# Patient Record
Sex: Female | Born: 2000 | Race: Black or African American | Hispanic: No | Marital: Single | Smoking: Never smoker
Health system: Southern US, Community
[De-identification: ages and names within clinical notes are randomized; demographics above are authoritative.]

## PROBLEM LIST (undated history)

## (undated) DIAGNOSIS — S8263XA Displaced fracture of lateral malleolus of unspecified fibula, initial encounter for closed fracture: Secondary | ICD-10-CM

## (undated) DIAGNOSIS — F909 Attention-deficit hyperactivity disorder, unspecified type: Secondary | ICD-10-CM

## (undated) DIAGNOSIS — J45909 Unspecified asthma, uncomplicated: Secondary | ICD-10-CM

## (undated) DIAGNOSIS — F419 Anxiety disorder, unspecified: Secondary | ICD-10-CM

## (undated) DIAGNOSIS — F4310 Post-traumatic stress disorder, unspecified: Secondary | ICD-10-CM

## (undated) DIAGNOSIS — F329 Major depressive disorder, single episode, unspecified: Secondary | ICD-10-CM

## (undated) DIAGNOSIS — F32A Depression, unspecified: Secondary | ICD-10-CM

## (undated) DIAGNOSIS — F840 Autistic disorder: Secondary | ICD-10-CM

## (undated) HISTORY — DX: Anxiety disorder, unspecified: F41.9

## (undated) HISTORY — DX: Post-traumatic stress disorder, unspecified: F43.10

## (undated) HISTORY — DX: Depression, unspecified: F32.A

---
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
## 2018-05-03 DIAGNOSIS — N764 Abscess of vulva: Secondary | ICD-10-CM | POA: Diagnosis not present

## 2018-05-04 DIAGNOSIS — N764 Abscess of vulva: Secondary | ICD-10-CM | POA: Diagnosis not present

## 2018-05-05 DIAGNOSIS — N764 Abscess of vulva: Secondary | ICD-10-CM | POA: Diagnosis not present

## 2018-10-20 DIAGNOSIS — F4310 Post-traumatic stress disorder, unspecified: Secondary | ICD-10-CM | POA: Diagnosis not present

## 2018-10-31 DIAGNOSIS — Z3046 Encounter for surveillance of implantable subdermal contraceptive: Secondary | ICD-10-CM | POA: Diagnosis not present

## 2018-10-31 DIAGNOSIS — Z3009 Encounter for other general counseling and advice on contraception: Secondary | ICD-10-CM | POA: Diagnosis not present

## 2018-11-03 DIAGNOSIS — F4310 Post-traumatic stress disorder, unspecified: Secondary | ICD-10-CM | POA: Diagnosis not present

## 2018-11-04 DIAGNOSIS — H40033 Anatomical narrow angle, bilateral: Secondary | ICD-10-CM | POA: Diagnosis not present

## 2018-11-04 DIAGNOSIS — H1013 Acute atopic conjunctivitis, bilateral: Secondary | ICD-10-CM | POA: Diagnosis not present

## 2018-11-24 DIAGNOSIS — F4310 Post-traumatic stress disorder, unspecified: Secondary | ICD-10-CM | POA: Diagnosis not present

## 2018-12-01 DIAGNOSIS — F4310 Post-traumatic stress disorder, unspecified: Secondary | ICD-10-CM | POA: Diagnosis not present

## 2018-12-08 DIAGNOSIS — F4310 Post-traumatic stress disorder, unspecified: Secondary | ICD-10-CM | POA: Diagnosis not present

## 2018-12-22 DIAGNOSIS — F4310 Post-traumatic stress disorder, unspecified: Secondary | ICD-10-CM | POA: Diagnosis not present

## 2019-01-09 DIAGNOSIS — Z03818 Encounter for observation for suspected exposure to other biological agents ruled out: Secondary | ICD-10-CM | POA: Diagnosis not present

## 2019-01-12 DIAGNOSIS — F4310 Post-traumatic stress disorder, unspecified: Secondary | ICD-10-CM | POA: Diagnosis not present

## 2019-01-24 IMAGING — DX DG TIBIA/FIBULA 2V*R*
4 series · 4 of 4 positions shown · non-contrast
Comparison: 02/17/2014

CLINICAL DATA: Injury

EXAM:
RIGHT TIBIA AND FIBULA - 2 VIEW

[tibia ap (1 of 2)]
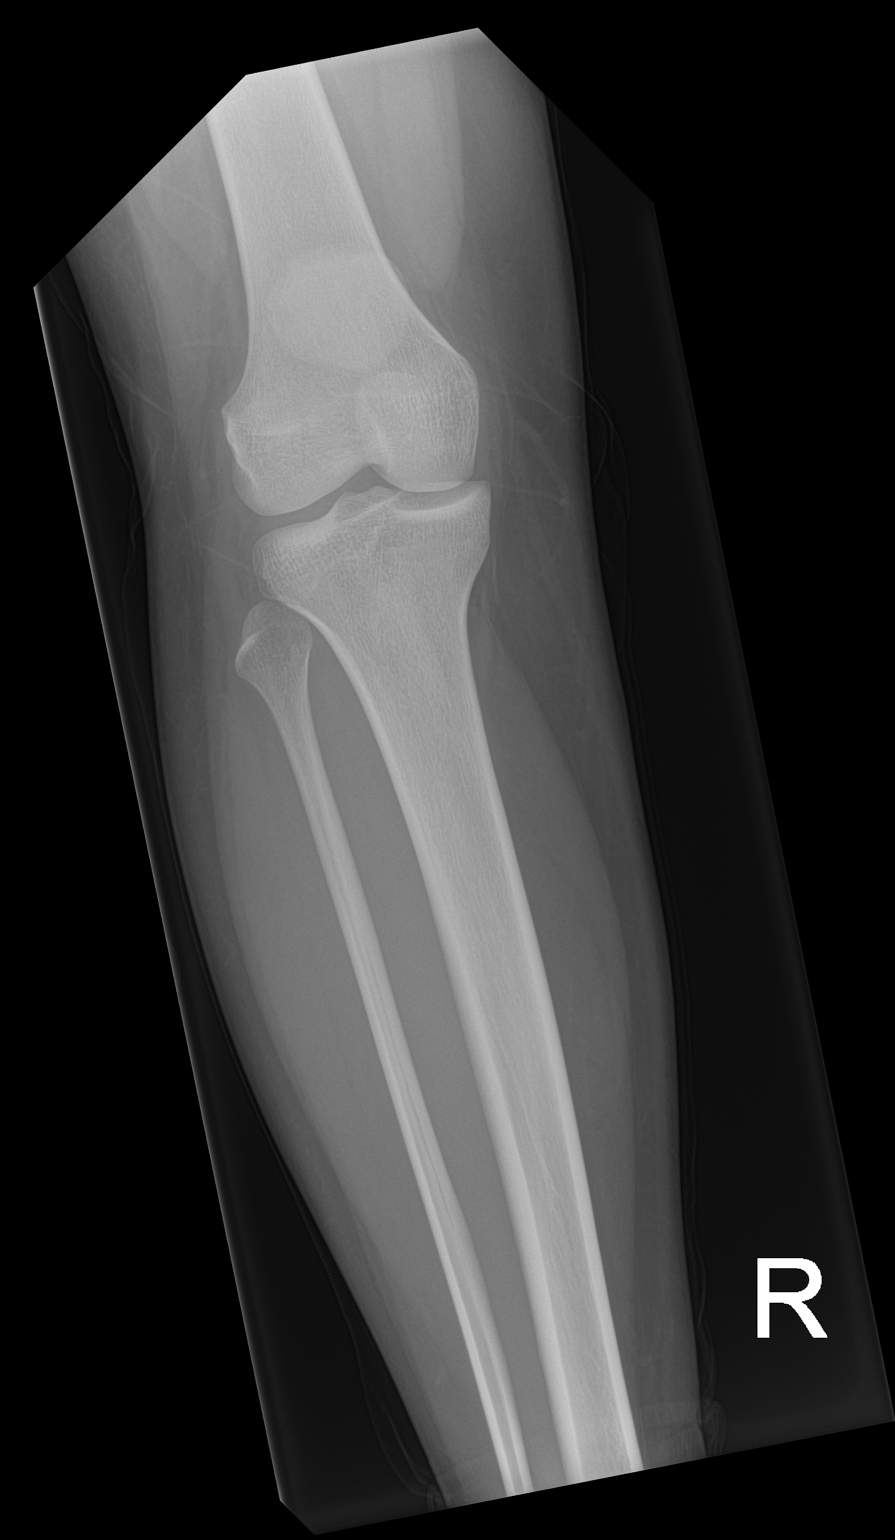

[tibia ap (2 of 2)]
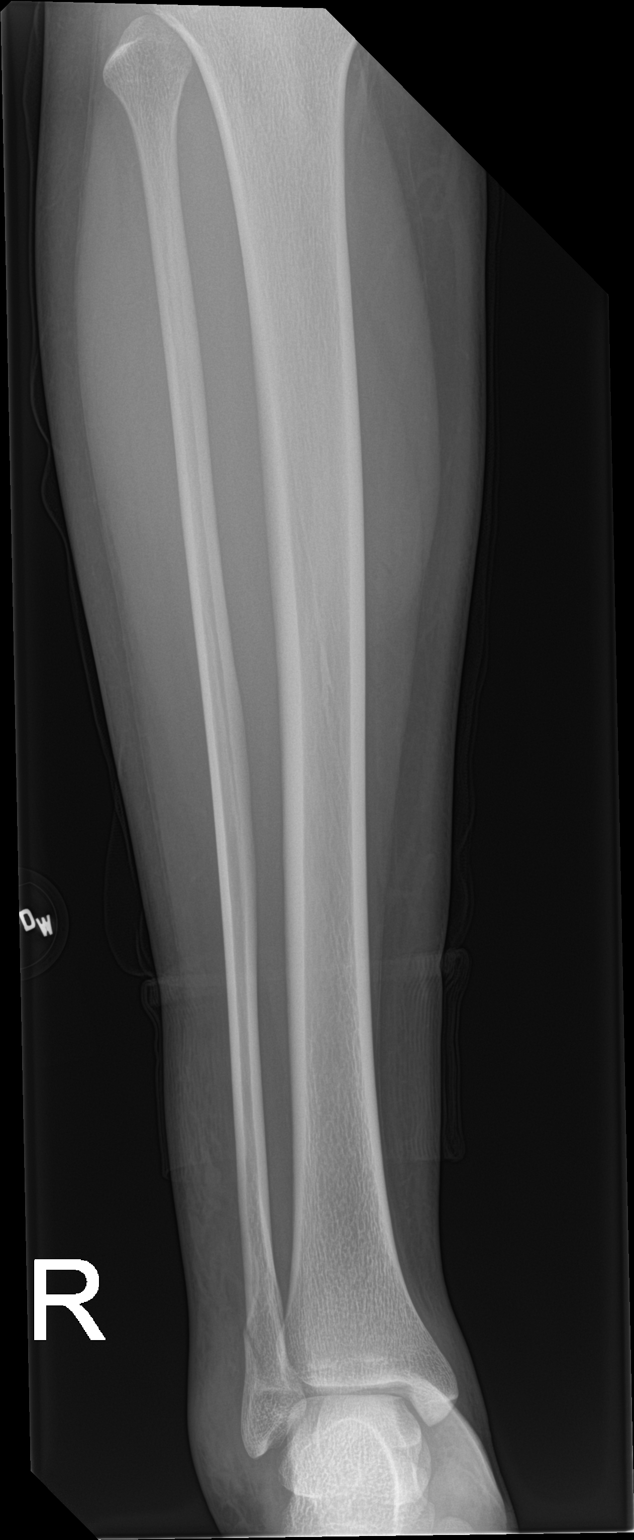

[tibia lat (1 of 2)]
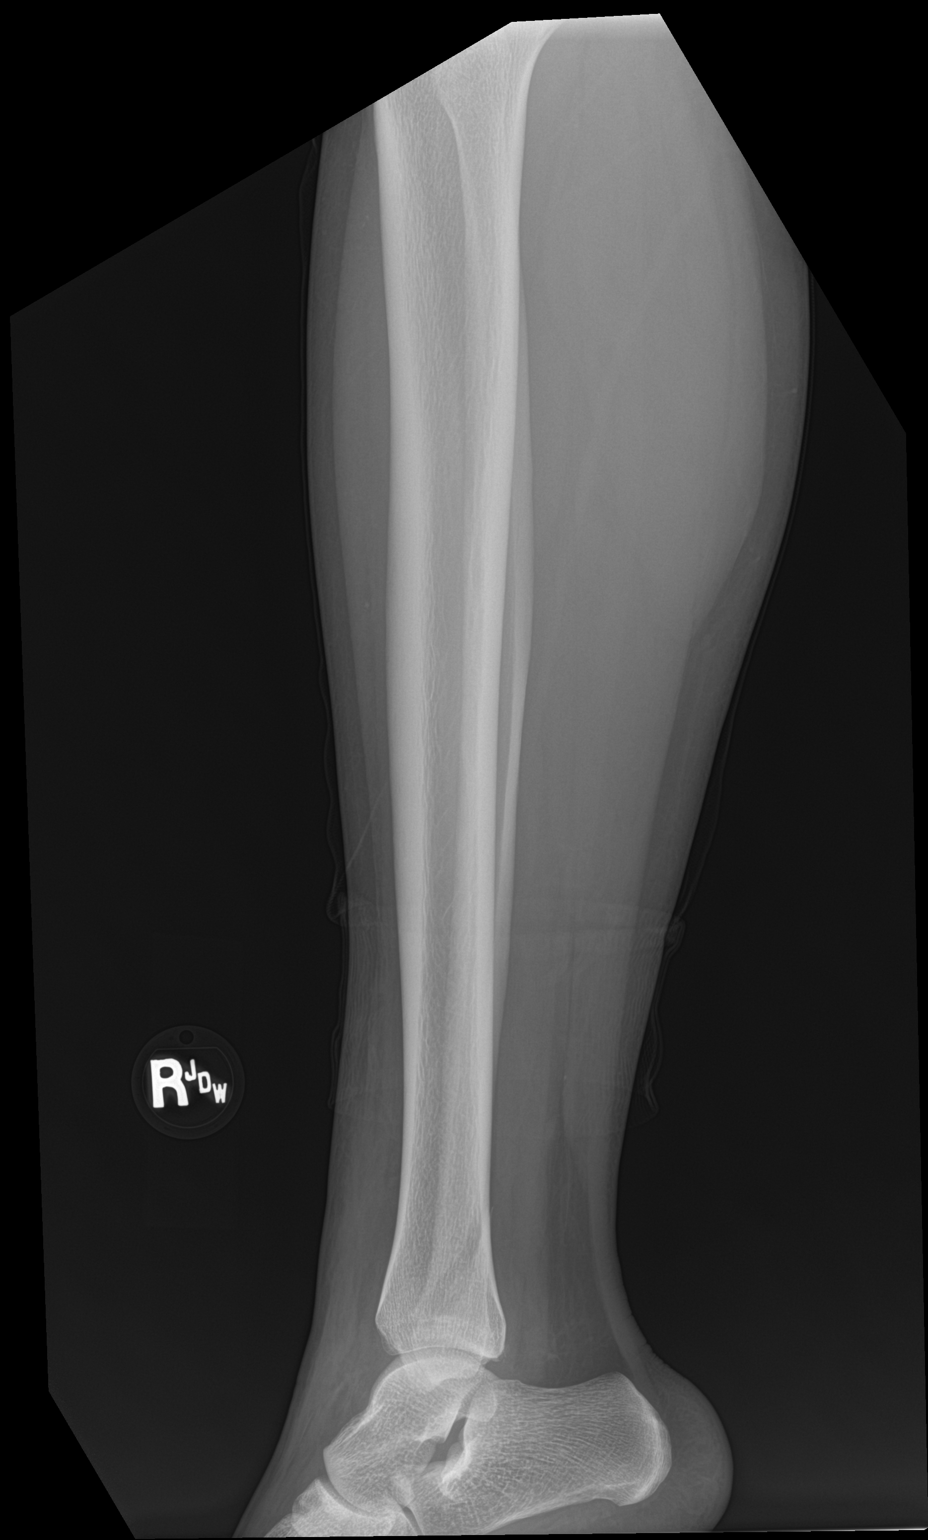

[tibia lat (2 of 2)]
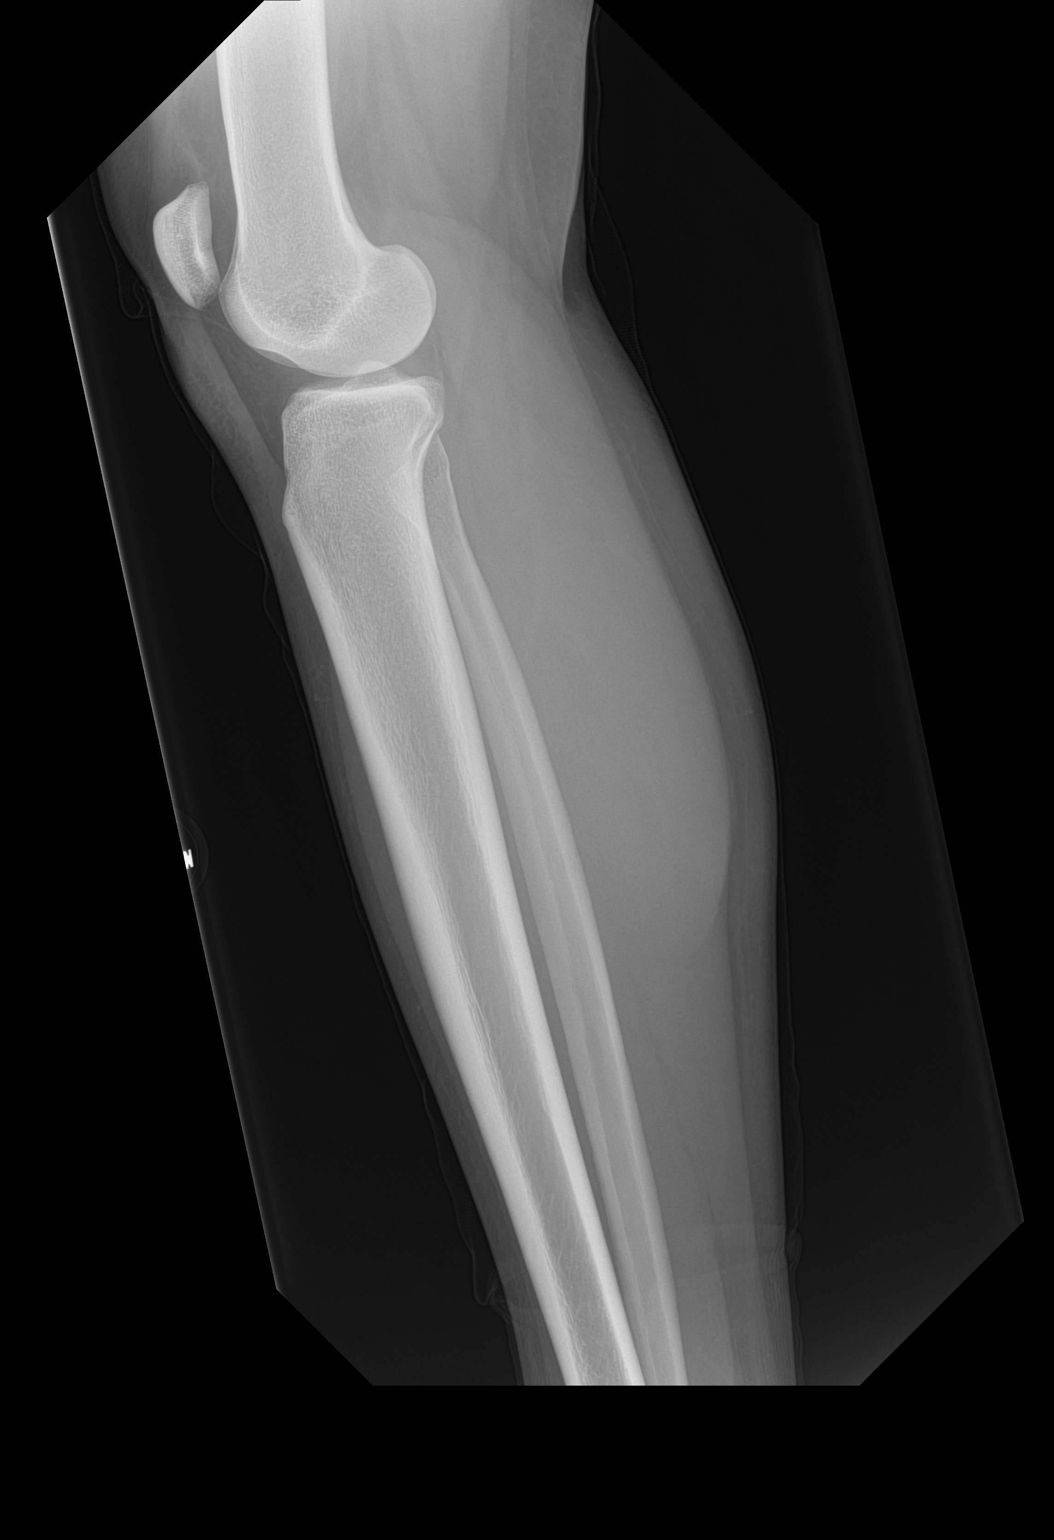

[4 of 4 positions shown; findings below may reference images not displayed]

FINDINGS: The patella is somewhat elevated but this is a stable finding.
Patellar tendon is grossly intact. There is a minimally displaced
fracture involving the distal fibula through the lateral malleolus
which extends into the ankle joint.
IMPRESSION: Minimally displaced acute lateral malleolus fracture. Correlation
with ankle study is recommended.

## 2019-02-17 DIAGNOSIS — F4310 Post-traumatic stress disorder, unspecified: Secondary | ICD-10-CM | POA: Diagnosis not present

## 2019-02-19 DIAGNOSIS — F4310 Post-traumatic stress disorder, unspecified: Secondary | ICD-10-CM | POA: Diagnosis not present

## 2019-03-07 DIAGNOSIS — F4310 Post-traumatic stress disorder, unspecified: Secondary | ICD-10-CM | POA: Diagnosis not present

## 2019-04-13 DIAGNOSIS — F4310 Post-traumatic stress disorder, unspecified: Secondary | ICD-10-CM | POA: Diagnosis not present

## 2019-04-19 DIAGNOSIS — F4310 Post-traumatic stress disorder, unspecified: Secondary | ICD-10-CM | POA: Diagnosis not present

## 2019-05-03 DIAGNOSIS — F4310 Post-traumatic stress disorder, unspecified: Secondary | ICD-10-CM | POA: Diagnosis not present

## 2019-05-07 DIAGNOSIS — J45909 Unspecified asthma, uncomplicated: Secondary | ICD-10-CM | POA: Diagnosis not present

## 2019-05-07 DIAGNOSIS — F4310 Post-traumatic stress disorder, unspecified: Secondary | ICD-10-CM | POA: Diagnosis not present

## 2019-05-07 DIAGNOSIS — F418 Other specified anxiety disorders: Secondary | ICD-10-CM | POA: Diagnosis not present

## 2019-05-17 DIAGNOSIS — F4310 Post-traumatic stress disorder, unspecified: Secondary | ICD-10-CM | POA: Diagnosis not present

## 2019-05-31 DIAGNOSIS — F4310 Post-traumatic stress disorder, unspecified: Secondary | ICD-10-CM | POA: Diagnosis not present

## 2019-06-01 DIAGNOSIS — F4310 Post-traumatic stress disorder, unspecified: Secondary | ICD-10-CM | POA: Diagnosis not present

## 2019-06-28 ENCOUNTER — Other Ambulatory Visit: Payer: Self-pay

## 2019-06-28 ENCOUNTER — Ambulatory Visit: Payer: Federal, State, Local not specified - PPO | Admitting: Neurology

## 2019-06-28 ENCOUNTER — Encounter: Payer: Self-pay | Admitting: Neurology

## 2019-06-28 VITALS — BP 118/90 | HR 86 | Ht 66.0 in | Wt 151.0 lb

## 2019-06-28 DIAGNOSIS — R799 Abnormal finding of blood chemistry, unspecified: Secondary | ICD-10-CM | POA: Diagnosis not present

## 2019-06-28 DIAGNOSIS — F418 Other specified anxiety disorders: Secondary | ICD-10-CM

## 2019-06-28 DIAGNOSIS — R404 Transient alteration of awareness: Secondary | ICD-10-CM | POA: Diagnosis not present

## 2019-06-28 DIAGNOSIS — R7989 Other specified abnormal findings of blood chemistry: Secondary | ICD-10-CM | POA: Diagnosis not present

## 2019-06-28 MED ORDER — LAMOTRIGINE 25 MG PO TABS: ORAL_TABLET | ORAL | 0 refills | Status: DC

## 2019-06-28 MED ORDER — LAMOTRIGINE 100 MG PO TABS: 100.0000 mg | ORAL_TABLET | Freq: Two times a day (BID) | ORAL | 11 refills | Status: DC
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
## 2019-06-29 LAB — COMPREHENSIVE METABOLIC PANEL
ALT: 13 IU/L (ref 0–32)
AST: 15 IU/L (ref 0–40)
Albumin/Globulin Ratio: 1.9 (ref 1.2–2.2)
Albumin: 4.9 g/dL (ref 3.9–5.0)
Alkaline Phosphatase: 83 IU/L (ref 45–106)
BUN/Creatinine Ratio: 11 (ref 9–23)
BUN: 8 mg/dL (ref 6–20)
Bilirubin Total: 0.2 mg/dL (ref 0.0–1.2)
CO2: 22 mmol/L (ref 20–29)
Calcium: 9.9 mg/dL (ref 8.7–10.2)
Chloride: 103 mmol/L (ref 96–106)
Creatinine, Ser: 0.74 mg/dL (ref 0.57–1.00)
GFR calc Af Amer: 135 mL/min/{1.73_m2} (ref >59)
GFR calc non Af Amer: 117 mL/min/{1.73_m2} (ref >59)
Globulin, Total: 2.6 g/dL (ref 1.5–4.5)
Glucose: 83 mg/dL (ref 65–99)
Potassium: 4.2 mmol/L (ref 3.5–5.2)
Sodium: 138 mmol/L (ref 134–144)
Total Protein: 7.5 g/dL (ref 6.0–8.5)

## 2019-06-29 LAB — CBC WITH DIFFERENTIAL
Basophils Absolute: 0.1 10*3/uL (ref 0.0–0.2)
Basos: 1 %
EOS (ABSOLUTE): 0.3 10*3/uL (ref 0.0–0.4)
Eos: 5 %
Hematocrit: 45.4 % (ref 34.0–46.6)
Hemoglobin: 14.1 g/dL (ref 11.1–15.9)
Immature Grans (Abs): 0 10*3/uL (ref 0.0–0.1)
Immature Granulocytes: 0 %
Lymphocytes Absolute: 2.2 10*3/uL (ref 0.7–3.1)
Lymphs: 29 %
MCH: 26.8 pg (ref 26.6–33.0)
MCHC: 31.1 g/dL — ABNORMAL LOW (ref 31.5–35.7)
MCV: 86 fL (ref 79–97)
Monocytes Absolute: 0.6 10*3/uL (ref 0.1–0.9)
Monocytes: 8 %
Neutrophils Absolute: 4.4 10*3/uL (ref 1.4–7.0)
Neutrophils: 57 %
RBC: 5.26 x10E6/uL (ref 3.77–5.28)
RDW: 12.5 % (ref 11.7–15.4)
WBC: 7.6 10*3/uL (ref 3.4–10.8)

## 2019-06-29 LAB — TSH: TSH: 1.57 u[IU]/mL (ref 0.450–4.500)

## 2019-06-29 LAB — HIV ANTIBODY (ROUTINE TESTING W REFLEX): HIV Screen 4th Generation wRfx: NONREACTIVE

## 2019-06-29 LAB — RPR: RPR Ser Ql: NONREACTIVE

## 2019-07-03 ENCOUNTER — Other Ambulatory Visit: Payer: Self-pay

## 2019-07-03 ENCOUNTER — Telehealth: Payer: Self-pay | Admitting: *Deleted

## 2019-07-03 ENCOUNTER — Ambulatory Visit: Payer: Federal, State, Local not specified - PPO | Admitting: Neurology

## 2019-07-03 DIAGNOSIS — R410 Disorientation, unspecified: Secondary | ICD-10-CM | POA: Diagnosis not present

## 2019-07-03 DIAGNOSIS — R404 Transient alteration of awareness: Secondary | ICD-10-CM

## 2019-07-03 DIAGNOSIS — F418 Other specified anxiety disorders: Secondary | ICD-10-CM

## 2019-07-03 NOTE — Telephone Encounter (Signed)
I have spoken to the patient and she has been provided with her lab results.

## 2019-07-03 NOTE — Telephone Encounter (Signed)
-----   Message from Levert Feinstein, MD sent at 07/03/2019  3:11 PM EDT ----- Please call patient for normal laboratory result

## 2019-07-05 ENCOUNTER — Telehealth: Payer: Self-pay | Admitting: Neurology

## 2019-07-05 DIAGNOSIS — R404 Transient alteration of awareness: Secondary | ICD-10-CM

## 2019-07-05 NOTE — Procedures (Signed)
   HISTORY: 19 year old female presented with transient episode of lapse of memory  TECHNIQUE:  This is a routine 16 channel EEG recording with one channel devoted to a limited EKG recording.  It was performed during wakefulness, drowsiness and asleep.  Hyperventilation and photic stimulation were performed as activating procedures.  There are minimum muscle and movement artifact noted.  Upon maximum arousal, posterior dominant waking rhythm consistent of rhythmic alpha range activity, with frequency of 9 hz. Activities are symmetric over the bilateral posterior derivations and attenuated with eye opening.  Hyperventilation produced mild/moderate buildup with higher amplitude and the slower activities noted.  Photic stimulation did not alter the tracing.  During EEG recording, patient developed drowsiness and no deeper stage of sleep was achieved During EEG recording, there was no epileptiform discharge noted.  EKG demonstrate irregular cardiac rhythm 74 bpm  CONCLUSION: This is a  normal awake EEG.  There is no electrodiagnostic evidence of epileptiform discharge.  There was evidence of irregular cardiac rhythm  Levert Feinstein, M.D. Ph.D.  [facility] [address] [place], [place] [address] Phone: [phone] Fax:      [phone]

## 2019-07-05 NOTE — Telephone Encounter (Signed)
Please call patient, EEG was normal, but during EEG recording, there was one channel that was dedicated to EKG, which showed irregular cardiac rhythm, I will refer her to cardiology for further evaluation

## 2019-07-09 NOTE — Telephone Encounter (Signed)
I returned the call to the patient. She verbalized understanding of her results below. She is agreeable to the cardiology referral.

## 2019-07-09 NOTE — Telephone Encounter (Signed)
Patient returned your call.

## 2019-07-09 NOTE — Telephone Encounter (Signed)
I attempted to reach the patient multiple times (both last week and today). There is no answer and her voicemail box has not been set up.

## 2019-07-10 ENCOUNTER — Telehealth: Payer: Self-pay | Admitting: Neurology

## 2019-07-10 NOTE — Telephone Encounter (Signed)
LVM for pt to call back about scheduling mri  [employer] auth: NPR Ref # [id number]

## 2019-07-16 ENCOUNTER — Encounter: Payer: Self-pay | Admitting: Neurology

## 2019-07-17 DIAGNOSIS — Z0000 Encounter for general adult medical examination without abnormal findings: Secondary | ICD-10-CM | POA: Diagnosis not present

## 2019-08-29 ENCOUNTER — Other Ambulatory Visit: Payer: Self-pay

## 2019-08-29 ENCOUNTER — Telehealth: Payer: Self-pay | Admitting: Neurology

## 2019-08-29 ENCOUNTER — Encounter: Payer: Self-pay | Admitting: Neurology

## 2019-08-29 ENCOUNTER — Ambulatory Visit: Payer: Federal, State, Local not specified - PPO | Admitting: Neurology

## 2019-08-29 VITALS — BP 139/98 | HR 94 | Ht 66.0 in | Wt 150.0 lb

## 2019-08-29 DIAGNOSIS — R404 Transient alteration of awareness: Secondary | ICD-10-CM | POA: Diagnosis not present

## 2019-08-29 DIAGNOSIS — F418 Other specified anxiety disorders: Secondary | ICD-10-CM | POA: Diagnosis not present

## 2019-08-29 NOTE — Telephone Encounter (Signed)
Please make sure she is on schedule for MRI brain as previously ordered.
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
## 2019-09-04 NOTE — Telephone Encounter (Signed)
x2 lvm for pt to call back about scheduling mri  

## 2019-09-04 NOTE — Telephone Encounter (Signed)
Patient returned my call she is scheduled at [facility] for 09/12/19. 

## 2019-09-10 ENCOUNTER — Encounter: Payer: Self-pay | Admitting: General Practice

## 2019-09-12 ENCOUNTER — Ambulatory Visit: Payer: Federal, State, Local not specified - PPO

## 2019-09-12 DIAGNOSIS — F418 Other specified anxiety disorders: Secondary | ICD-10-CM

## 2019-09-12 DIAGNOSIS — R404 Transient alteration of awareness: Secondary | ICD-10-CM | POA: Diagnosis not present

## 2019-09-12 MED ORDER — GADOBENATE DIMEGLUMINE 529 MG/ML IV SOLN
15.0000 mL | Freq: Once | INTRAVENOUS | Status: AC | PRN
  Administered 2019-09-12: 15 mL via INTRAVENOUS

## 2019-12-25 ENCOUNTER — Ambulatory Visit
Admission: EM | Admit: 2019-12-25 | Discharge: 2019-12-25 | Disposition: A | Discharge status: Home / Self Care | Payer: Federal, State, Local not specified - PPO | Attending: Emergency Medicine | Admitting: Emergency Medicine

## 2019-12-25 ENCOUNTER — Other Ambulatory Visit: Payer: Self-pay

## 2019-12-25 DIAGNOSIS — K5900 Constipation, unspecified: Secondary | ICD-10-CM | POA: Diagnosis not present

## 2019-12-25 MED ORDER — POLYETHYLENE GLYCOL 3350 17 G PO PACK: 17.0000 g | PACK | Freq: Every day | ORAL | 0 refills | Status: DC

## 2019-12-25 NOTE — ED Provider Notes (Signed)
____________________________________________  Time seen: Approximately 7:51 PM  I have reviewed the triage vital signs and the nursing notes.   HISTORY  Chief Complaint Constipation (x 2 days)   Historian Patient    HPI Chloe Matthews is a 19 y.o. female presents to the urgent care with constipation for the past 2 days.  Patient states that she usually has a bowel movement once every 3 days but she states that stool feels harder to her than usual.  She denies abdominal pain or nausea.  She states that she has taken chocolate laxatives but is attempted no other alleviating measures.  Past Medical History:  Diagnosis Date  . Anxiety and depression   . Asthma    prn inhaler  . Lateral malleolar fracture 05/25/2016   right  . PTSD (post-traumatic stress disorder)      Immunizations up to date:  Yes.     Past Medical History:  Diagnosis Date  . Anxiety and depression   . Asthma    prn inhaler  . Lateral malleolar fracture 05/25/2016   right  . PTSD (post-traumatic stress disorder)     Patient Active Problem List   Diagnosis Date Noted  . Alteration of consciousness 06/28/2019  . Depression with anxiety 06/28/2019  . Chlamydia 03/24/2017  . MDD (major depressive disorder), recurrent episode, severe (HCC) 03/22/2017    Past Surgical History:  Procedure Laterality Date  . ORIF ANKLE FRACTURE Right 06/10/2016   Procedure: OPEN REDUCTION INTERNAL FIXATION (ORIF) RIGHT ANKLE FRACTURE;  Surgeon: Toni Arthurs, MD;  Location: [facility];  Service: Orthopedics;  Laterality: Right;    Prior to Admission medications   Medication Sig Start Date End Date Taking? Authorizing Provider  albuterol (VENTOLIN HFA) 108 (90 Base) MCG/ACT inhaler Inhale into the lungs every 6 (six) hours as needed for wheezing or shortness of breath.    [provider]  loratadine (CLARITIN) 10 MG tablet Take 10 mg by mouth daily.    [provider]   polyethylene glycol (MIRALAX) 17 g packet Take 17 g by mouth daily. 12/25/19   Orvil Feil, PA-C    Allergies Drug class [trazodone and nefazodone] and Pollen extract  Family History  Problem Relation Age of Onset  . Hypertension Mother   . Heart disease Mother        MI  . Stroke Mother   . Hypertension Maternal Grandmother   . Stroke Maternal Grandmother   . Heart disease Maternal Grandfather        MI  . Depression Father     Social History Social History   Tobacco Use  . Smoking status: Passive Smoke Exposure - Never Smoker  . Smokeless tobacco: Never Used  . Tobacco comment: stepfather smoked outside  Vaping Use  . Vaping Use: Never used  Substance Use Topics  . Alcohol use: No  . Drug use: No     Review of Systems  Constitutional: No fever/chills Eyes:  No discharge ENT: No upper respiratory complaints. Respiratory: no cough. No SOB/ use of accessory muscles to breath Gastrointestinal: Patient has constipation.  Musculoskeletal: Negative for musculoskeletal pain. Skin: Negative for rash, abrasions, lacerations, ecchymosis.    ____________________________________________   PHYSICAL EXAM:  VITAL SIGNS: ED Triage Vitals  Enc Vitals Group     BP 12/25/19 1930 136/85     Pulse Rate 12/25/19 1930 86     Resp 12/25/19 1930 17     Temp 12/25/19 1930 98 F (36.7 C)  Temp Source 12/25/19 1930 Oral     SpO2 12/25/19 1930 100 %     Weight --      Height --      Head Circumference --      Peak Flow --      Pain Score 12/25/19 1932 7     Pain Loc --      Pain Edu? --      Excl. in GC? --      Constitutional: Alert and oriented. Well appearing and in no acute distress. Eyes: Conjunctivae are normal. PERRL. EOMI. Head: Atraumatic. Cardiovascular: Normal rate, regular rhythm. Normal S1 and S2.  Good peripheral circulation. Respiratory: Normal respiratory effort without tachypnea or retractions. Lungs CTAB. Good air entry to the bases with no  decreased or absent breath sounds Gastrointestinal: Bowel sounds x 4 quadrants. Soft and nontender to palpation. No guarding or rigidity. No distention. Musculoskeletal: Full range of motion to all extremities. No obvious deformities noted Neurologic:  Normal for age. No gross focal neurologic deficits are appreciated.  Skin:  Skin is warm, dry and intact. No rash noted. Psychiatric: Mood and affect are normal for age. Speech and behavior are normal.   ____________________________________________   LABS (all labs ordered are listed, but only abnormal results are displayed)  Labs Reviewed - No data to display ____________________________________________  EKG   ____________________________________________  RADIOLOGY   No results found.  ____________________________________________    PROCEDURES  Procedure(s) performed:     Procedures     Medications - No data to display   ____________________________________________   INITIAL IMPRESSION / ASSESSMENT AND PLAN / ED COURSE  Pertinent labs & imaging results that were available during my care of the patient were reviewed by me and considered in my medical decision making (see chart for details).      Assessment and plan Constipation 19 year old female presents to the urgent care with constipation for the past 2 days.  Recommended MiraLAX once daily for the next 7 days, increase hydration and increasing intake of soluble fiber.  Return precautions were given to return with new or worsening symptoms.  All patient questions were answered.   ____________________________________________  FINAL CLINICAL IMPRESSION(S) / ED DIAGNOSES  Final diagnoses:  Constipation, unspecified constipation type      NEW MEDICATIONS STARTED DURING THIS VISIT:  ED Discharge Orders         Ordered    polyethylene glycol (MIRALAX) 17 g packet  Daily        12/25/19 1950              This chart was dictated using voice  recognition software/Dragon. Despite best efforts to proofread, errors can occur which can change the meaning. Any change was purely unintentional.     Orvil Feil, PA-C 12/25/19 1953

## 2019-12-25 NOTE — Discharge Instructions (Addendum)
Take Miralax once daily for the next seven days.  

## 2019-12-25 NOTE — ED Triage Notes (Signed)
Patient states she has not had a bowel movement for 2 days and is constipated. Patients also states she only has a bowel movement usually every 3 days. Pt states it won't come out. Pt is aox4 and ambulatory.

## 2020-01-30 DIAGNOSIS — Z7189 Other specified counseling: Secondary | ICD-10-CM | POA: Diagnosis not present

## 2020-01-30 DIAGNOSIS — Z23 Encounter for immunization: Secondary | ICD-10-CM | POA: Diagnosis not present

## 2020-03-04 ENCOUNTER — Encounter: Payer: Self-pay | Admitting: Neurology

## 2020-03-04 ENCOUNTER — Ambulatory Visit: Payer: Federal, State, Local not specified - PPO | Admitting: Neurology
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
## 2020-04-03 ENCOUNTER — Ambulatory Visit (HOSPITAL_COMMUNITY)
Admission: EM | Admit: 2020-04-03 | Discharge: 2020-04-04 | Disposition: A | Discharge status: Home / Self Care | Payer: Federal, State, Local not specified - PPO | Attending: Nurse Practitioner | Admitting: Nurse Practitioner

## 2020-04-03 ENCOUNTER — Other Ambulatory Visit: Payer: Self-pay

## 2020-04-03 DIAGNOSIS — F332 Major depressive disorder, recurrent severe without psychotic features: Secondary | ICD-10-CM | POA: Insufficient documentation

## 2020-04-03 DIAGNOSIS — F411 Generalized anxiety disorder: Secondary | ICD-10-CM

## 2020-04-03 DIAGNOSIS — R45851 Suicidal ideations: Secondary | ICD-10-CM | POA: Diagnosis not present

## 2020-04-03 DIAGNOSIS — Z7722 Contact with and (suspected) exposure to environmental tobacco smoke (acute) (chronic): Secondary | ICD-10-CM | POA: Diagnosis not present

## 2020-04-03 DIAGNOSIS — Z20822 Contact with and (suspected) exposure to covid-19: Secondary | ICD-10-CM | POA: Diagnosis not present

## 2020-04-03 DIAGNOSIS — F4310 Post-traumatic stress disorder, unspecified: Secondary | ICD-10-CM | POA: Diagnosis not present

## 2020-04-03 DIAGNOSIS — Z818 Family history of other mental and behavioral disorders: Secondary | ICD-10-CM | POA: Insufficient documentation

## 2020-04-03 LAB — CBC WITH DIFFERENTIAL/PLATELET
Abs Immature Granulocytes: 0.01 10*3/uL (ref 0.00–0.07)
Basophils Absolute: 0.1 10*3/uL (ref 0.0–0.1)
Basophils Relative: 1 %
Eosinophils Absolute: 0.2 10*3/uL (ref 0.0–0.5)
Eosinophils Relative: 3 %
HCT: 42.9 % (ref 36.0–46.0)
Hemoglobin: 13.7 g/dL (ref 12.0–15.0)
Immature Granulocytes: 0 %
Lymphocytes Relative: 31 %
Lymphs Abs: 2.5 10*3/uL (ref 0.7–4.0)
MCH: 27.3 pg (ref 26.0–34.0)
MCHC: 31.9 g/dL (ref 30.0–36.0)
MCV: 85.6 fL (ref 80.0–100.0)
Monocytes Absolute: 0.7 10*3/uL (ref 0.1–1.0)
Monocytes Relative: 9 %
Neutro Abs: 4.6 10*3/uL (ref 1.7–7.7)
Neutrophils Relative %: 56 %
Platelets: 302 10*3/uL (ref 150–400)
RBC: 5.01 MIL/uL (ref 3.87–5.11)
RDW: 12.1 % (ref 11.5–15.5)
WBC: 8 10*3/uL (ref 4.0–10.5)
nRBC: 0 % (ref 0.0–0.2)

## 2020-04-03 LAB — POCT URINE DRUG SCREEN - MANUAL ENTRY (I-SCREEN)
POC Amphetamine UR: NOT DETECTED
POC Buprenorphine (BUP): NOT DETECTED
POC Cocaine UR: NOT DETECTED
POC Marijuana UR: NOT DETECTED
POC Methadone UR: NOT DETECTED
POC Methamphetamine UR: NOT DETECTED
POC Morphine: NOT DETECTED
POC Oxazepam (BZO): NOT DETECTED
POC Oxycodone UR: NOT DETECTED
POC Secobarbital (BAR): NOT DETECTED

## 2020-04-03 LAB — RESP PANEL BY RT-PCR (FLU A&B, COVID) ARPGX2
Influenza A by PCR: NEGATIVE
Influenza B by PCR: NEGATIVE
SARS Coronavirus 2 by RT PCR: NEGATIVE

## 2020-04-03 LAB — POCT PREGNANCY, URINE: Preg Test, Ur: NEGATIVE

## 2020-04-03 LAB — POC SARS CORONAVIRUS 2 AG -  ED: SARS Coronavirus 2 Ag: NEGATIVE

## 2020-04-03 MED ORDER — MAGNESIUM HYDROXIDE 400 MG/5ML PO SUSP: 30.0000 mL | Freq: Every day | ORAL | Status: DC | PRN

## 2020-04-03 MED ORDER — ESCITALOPRAM OXALATE 10 MG PO TABS: 10.0000 mg | ORAL_TABLET | Freq: Every day | ORAL | Status: DC

## 2020-04-03 MED ORDER — ESCITALOPRAM OXALATE 5 MG PO TABS
5.0000 mg | ORAL_TABLET | Freq: Once | ORAL | Status: AC
  Administered 2020-04-03: 5 mg via ORAL
  Filled 2020-04-03: qty 1

## 2020-04-03 MED ORDER — ACETAMINOPHEN 325 MG PO TABS: 650.0000 mg | ORAL_TABLET | Freq: Four times a day (QID) | ORAL | Status: DC | PRN

## 2020-04-03 MED ORDER — HYDROXYZINE HCL 10 MG PO TABS
10.0000 mg | ORAL_TABLET | Freq: Three times a day (TID) | ORAL | Status: DC | PRN
  Administered 2020-04-03: 10 mg via ORAL
  Filled 2020-04-03: qty 1

## 2020-04-03 MED ORDER — ALUM & MAG HYDROXIDE-SIMETH 200-200-20 MG/5ML PO SUSP: 30.0000 mL | ORAL | Status: DC | PRN

## 2020-04-03 NOTE — BH Assessment (Addendum)
Triage: Chloe Matthews is a 20 year old female who presents to [facility] after speaking with her therapist today, who recommended she present to the [facility] for inpatient admission for suicidal ideations and possibly started on an anti-depressant. Pt reports having suicidal thoughts for the "past week". She further explains having thoughts to jump from the parking garage on campus [facility]). She reports having past suicide attempts "Once or twice a year since I was 20yo". Pt denied HI/AVH.  Determination of Need: Urgent
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
## 2020-04-03 NOTE — ED Notes (Signed)
Pt A&O x 4, presents with suicidal ideations, plan to jump off bridge or cypher gas.  Feeling worthless.  Skin search completed, no distress noted.  Denies HI or AVH.  Pt calm & cooperative.  Monitoring for safety.

## 2020-04-04 ENCOUNTER — Telehealth (HOSPITAL_COMMUNITY): Payer: Self-pay | Admitting: Psychiatry

## 2020-04-04 DIAGNOSIS — F332 Major depressive disorder, recurrent severe without psychotic features: Secondary | ICD-10-CM | POA: Diagnosis not present

## 2020-04-04 DIAGNOSIS — F4310 Post-traumatic stress disorder, unspecified: Secondary | ICD-10-CM

## 2020-04-04 DIAGNOSIS — F411 Generalized anxiety disorder: Secondary | ICD-10-CM | POA: Diagnosis not present

## 2020-04-04 LAB — TSH: TSH: 2.297 u[IU]/mL (ref 0.350–4.500)

## 2020-04-04 LAB — COMPREHENSIVE METABOLIC PANEL
ALT: 17 U/L (ref 0–44)
AST: 23 U/L (ref 15–41)
Albumin: 4.6 g/dL (ref 3.5–5.0)
Alkaline Phosphatase: 74 U/L (ref 38–126)
Anion gap: 10 (ref 5–15)
BUN: 9 mg/dL (ref 6–20)
CO2: 22 mmol/L (ref 22–32)
Calcium: 9.8 mg/dL (ref 8.9–10.3)
Chloride: 108 mmol/L (ref 98–111)
Creatinine, Ser: 0.7 mg/dL (ref 0.44–1.00)
GFR, Estimated: >60 mL/min (ref >60)
Glucose, Bld: 66 mg/dL — ABNORMAL LOW (ref 70–99)
Potassium: 3.3 mmol/L — ABNORMAL LOW (ref 3.5–5.1)
Sodium: 140 mmol/L (ref 135–145)
Total Bilirubin: 0.7 mg/dL (ref 0.3–1.2)
Total Protein: 8.3 g/dL — ABNORMAL HIGH (ref 6.5–8.1)

## 2020-04-04 LAB — LIPID PANEL
Cholesterol: 187 mg/dL (ref 0–200)
HDL: 80 mg/dL (ref >40)
LDL Cholesterol: 93 mg/dL (ref 0–99)
Total CHOL/HDL Ratio: 2.3 RATIO
Triglycerides: 68 mg/dL (ref <150)
VLDL: 14 mg/dL (ref 0–40)

## 2020-04-04 LAB — HEMOGLOBIN A1C
Hgb A1c MFr Bld: 5.4 % (ref 4.8–5.6)
Mean Plasma Glucose: 108.28 mg/dL

## 2020-04-04 MED ORDER — HYDROXYZINE HCL 10 MG PO TABS: 10.0000 mg | ORAL_TABLET | Freq: Three times a day (TID) | ORAL | 1 refills | Status: DC | PRN

## 2020-04-04 MED ORDER — POTASSIUM CHLORIDE CRYS ER 20 MEQ PO TBCR
20.0000 meq | EXTENDED_RELEASE_TABLET | Freq: Once | ORAL | Status: AC
  Administered 2020-04-04: 20 meq via ORAL
  Filled 2020-04-04: qty 1

## 2020-04-04 MED ORDER — ESCITALOPRAM OXALATE 10 MG PO TABS: 10.0000 mg | ORAL_TABLET | Freq: Every day | ORAL | 1 refills | Status: DC

## 2020-04-04 NOTE — ED Notes (Signed)
Pt offered breakfast; declined; given orange juice.

## 2020-04-04 NOTE — Discharge Instructions (Addendum)
Prescriptions sent to pharmacy at discharge. Patient agreeable to plan.  Given opportunity to ask questions.  Appears to feel comfortable with discharge denies any current suicidal or homicidal thought. Patient is also instructed prior to discharge to: Take all medications as prescribed by his/her mental healthcare provider. Report any adverse effects and or reactions from the medicines to her outpatient provider promptly. Patient has been instructed & cautioned: To not engage in alcohol and or illegal drug use while on prescription medicines. In the event of worsening symptoms, patient is instructed to call the crisis hotline, 911 and or go to the nearest ED for appropriate evaluation and treatment of symptoms. To follow-up with her primary care provider for your other medical issues, concerns and or health care needs.

## 2020-04-04 NOTE — ED Notes (Signed)
Patient A&O x 4, ambulatory. Patient discharged in no acute distress. Patient denied SI/HI, A/VH upon discharge. Patient verbalized understanding of all discharge instructions explained by staff, to include follow up appointments, RX's and safety plan. Patient escorted to lobby via staff for transport to home via her boyfriend. Safety maintained.
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
## 2020-04-04 NOTE — Telephone Encounter (Signed)
D:  Dr. Leone Haven referred pt to PHP/MH-IOP.  A:  Placed call to pt, but there was no answer and her vm is full.  Will try again on Mon. 04-07-20.  Inform Dr. Leone Haven.

## 2020-04-04 NOTE — ED Notes (Signed)
Pt sleeping in no acute distress. RR even and unlabored. Safety maintained. 

## 2020-04-04 NOTE — ED Notes (Signed)
Pt sleeping@this time. Breathing even and unlabored. Will continue to monitor for safety 

## 2020-04-04 NOTE — ED Notes (Signed)
Pt sitting up in chair. Denies SI/HI/AVH. Calm, cooperative with staff. Writer asked pt what was different about her feelings today vs yesterday, she replied, "I want to give the Lexapro and outpatient therapy a chance to work. I want to live. I just want the feelings of wanting to hurt myself to stop. I'm in college for business so I do have something going for myself". Praise and encouragement given. Informed pt to notify staff with any urges/impulses to harm self. Will monitor for safety.

## 2020-04-07 ENCOUNTER — Telehealth (HOSPITAL_COMMUNITY): Payer: Self-pay | Admitting: Psychiatry

## 2020-04-07 ENCOUNTER — Telehealth (HOSPITAL_COMMUNITY): Payer: Self-pay | Admitting: Family Medicine

## 2020-04-07 NOTE — Telephone Encounter (Signed)
Care Management - Follow Up [facility] Discharges   Writer attempted to make contact with patient today and was unsuccessful.  Writer was able to leave a HIPPA compliant voice message and will await callback.   

## 2020-04-29 DIAGNOSIS — F4310 Post-traumatic stress disorder, unspecified: Secondary | ICD-10-CM | POA: Diagnosis not present

## 2020-05-06 DIAGNOSIS — F418 Other specified anxiety disorders: Secondary | ICD-10-CM | POA: Diagnosis not present

## 2020-05-06 DIAGNOSIS — F4310 Post-traumatic stress disorder, unspecified: Secondary | ICD-10-CM | POA: Diagnosis not present

## 2020-05-10 DIAGNOSIS — Z708 Other sex counseling: Secondary | ICD-10-CM | POA: Diagnosis not present

## 2020-07-03 DIAGNOSIS — R109 Unspecified abdominal pain: Secondary | ICD-10-CM | POA: Diagnosis not present

## 2020-07-03 DIAGNOSIS — K5900 Constipation, unspecified: Secondary | ICD-10-CM | POA: Diagnosis not present

## 2020-07-03 DIAGNOSIS — F419 Anxiety disorder, unspecified: Secondary | ICD-10-CM | POA: Diagnosis not present

## 2020-07-11 DIAGNOSIS — G4700 Insomnia, unspecified: Secondary | ICD-10-CM | POA: Diagnosis not present

## 2020-07-11 DIAGNOSIS — F411 Generalized anxiety disorder: Secondary | ICD-10-CM | POA: Diagnosis not present

## 2020-07-11 DIAGNOSIS — F331 Major depressive disorder, recurrent, moderate: Secondary | ICD-10-CM | POA: Diagnosis not present

## 2020-07-11 DIAGNOSIS — F4310 Post-traumatic stress disorder, unspecified: Secondary | ICD-10-CM | POA: Diagnosis not present

## 2020-08-18 DIAGNOSIS — F4310 Post-traumatic stress disorder, unspecified: Secondary | ICD-10-CM | POA: Diagnosis not present

## 2020-09-04 DIAGNOSIS — F4310 Post-traumatic stress disorder, unspecified: Secondary | ICD-10-CM | POA: Diagnosis not present

## 2020-09-04 DIAGNOSIS — L03114 Cellulitis of left upper limb: Secondary | ICD-10-CM | POA: Diagnosis not present

## 2020-10-02 DIAGNOSIS — Z113 Encounter for screening for infections with a predominantly sexual mode of transmission: Secondary | ICD-10-CM | POA: Diagnosis not present

## 2020-10-02 DIAGNOSIS — Z20822 Contact with and (suspected) exposure to covid-19: Secondary | ICD-10-CM | POA: Diagnosis not present

## 2020-10-02 DIAGNOSIS — Z03818 Encounter for observation for suspected exposure to other biological agents ruled out: Secondary | ICD-10-CM | POA: Diagnosis not present

## 2020-10-02 DIAGNOSIS — Z202 Contact with and (suspected) exposure to infections with a predominantly sexual mode of transmission: Secondary | ICD-10-CM | POA: Diagnosis not present

## 2020-10-13 DIAGNOSIS — F4310 Post-traumatic stress disorder, unspecified: Secondary | ICD-10-CM | POA: Diagnosis not present

## 2020-10-23 DIAGNOSIS — Z03818 Encounter for observation for suspected exposure to other biological agents ruled out: Secondary | ICD-10-CM | POA: Diagnosis not present

## 2020-10-23 DIAGNOSIS — Z20822 Contact with and (suspected) exposure to covid-19: Secondary | ICD-10-CM | POA: Diagnosis not present

## 2020-11-11 DIAGNOSIS — Z03818 Encounter for observation for suspected exposure to other biological agents ruled out: Secondary | ICD-10-CM | POA: Diagnosis not present

## 2020-11-11 DIAGNOSIS — Z20822 Contact with and (suspected) exposure to covid-19: Secondary | ICD-10-CM | POA: Diagnosis not present

## 2021-04-02 DIAGNOSIS — F840 Autistic disorder: Secondary | ICD-10-CM | POA: Diagnosis not present

## 2021-05-19 DIAGNOSIS — F840 Autistic disorder: Secondary | ICD-10-CM | POA: Diagnosis not present

## 2021-07-09 DIAGNOSIS — F4310 Post-traumatic stress disorder, unspecified: Secondary | ICD-10-CM | POA: Diagnosis not present

## 2021-07-22 DIAGNOSIS — F4310 Post-traumatic stress disorder, unspecified: Secondary | ICD-10-CM | POA: Diagnosis not present

## 2021-08-12 DIAGNOSIS — F4310 Post-traumatic stress disorder, unspecified: Secondary | ICD-10-CM | POA: Diagnosis not present

## 2021-08-29 DIAGNOSIS — F4310 Post-traumatic stress disorder, unspecified: Secondary | ICD-10-CM | POA: Diagnosis not present

## 2022-04-26 ENCOUNTER — Ambulatory Visit
Admission: EM | Admit: 2022-04-26 | Discharge: 2022-04-26 | Disposition: A | Discharge status: Home / Self Care | Payer: Federal, State, Local not specified - PPO | Attending: Urgent Care | Admitting: Urgent Care

## 2022-04-26 DIAGNOSIS — F840 Autistic disorder: Secondary | ICD-10-CM

## 2022-04-26 DIAGNOSIS — F909 Attention-deficit hyperactivity disorder, unspecified type: Secondary | ICD-10-CM

## 2022-04-26 DIAGNOSIS — F419 Anxiety disorder, unspecified: Secondary | ICD-10-CM

## 2022-04-26 HISTORY — DX: Autistic disorder: F84.0

## 2022-04-26 HISTORY — DX: Attention-deficit hyperactivity disorder, unspecified type: F90.9
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
## 2022-04-26 NOTE — ED Triage Notes (Signed)
Patient presents to UC for anxiety related to work stress. States she has being bullied at work causing her to feel anxious. Currently trying to transfer departments. These events have caused her to miss work. Hx of anxiety, does not take any medications.

## 2022-05-26 ENCOUNTER — Encounter (HOSPITAL_COMMUNITY): Payer: Self-pay | Admitting: Student in an Organized Health Care Education/Training Program

## 2022-05-26 ENCOUNTER — Ambulatory Visit (HOSPITAL_BASED_OUTPATIENT_CLINIC_OR_DEPARTMENT_OTHER)
Payer: Federal, State, Local not specified - PPO | Admitting: Student in an Organized Health Care Education/Training Program

## 2022-05-26 VITALS — BP 131/86 | HR 90 | Resp 12 | Wt 169.0 lb

## 2022-05-26 DIAGNOSIS — F902 Attention-deficit hyperactivity disorder, combined type: Secondary | ICD-10-CM | POA: Diagnosis not present

## 2022-05-26 MED ORDER — METHYLPHENIDATE HCL ER (LA) 20 MG PO CP24: 20.0000 mg | ORAL_CAPSULE | Freq: Every day | ORAL | 0 refills | Status: DC

## 2022-05-26 NOTE — Patient Instructions (Signed)
Please contact one of the following facilities to start medication management and therapy services:   [facility]  [address] [place], [place] [address] [phone]  [facility]  [address], [place], [place] [address] [phone]  [facility]  [address]  [place], [place] [address] [phone]  [facility]  [address] [place], [place] [address] [phone]  [facility]  [address],  [place], [place] [address] [phone]  
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
## 2022-06-01 ENCOUNTER — Telehealth (HOSPITAL_COMMUNITY): Payer: Self-pay

## 2022-06-01 ENCOUNTER — Telehealth (HOSPITAL_COMMUNITY): Payer: Self-pay | Admitting: *Deleted

## 2022-06-01 NOTE — Telephone Encounter (Signed)
Medication management - Prior authorization completed online with CoverMyMeds for her prescribed Methylphenidate HCL ER (LA) 20 mg capsules and approved by [employer]. Called pt's [facility] to inform as pharmacist reported not going through yet but would keep trying since just approved and would call us back if any issues.

## 2022-06-01 NOTE — Telephone Encounter (Signed)
Fax received for approval of Methylphnidate ER 20mg  from 05/02/22-06/01/2023.

## 2022-07-07 ENCOUNTER — Ambulatory Visit (HOSPITAL_COMMUNITY): Payer: Medicaid Other | Admitting: Student in an Organized Health Care Education/Training Program

## 2022-07-07 NOTE — Progress Notes (Deleted)
BH MD/PA/NP OP Progress Note  07/07/2022 1:00 PM Chloe Matthews  MRN:  [id number]  Chief Complaint: No chief complaint on file.  HPI: Chloe Matthews is a 22 yo patient with PPH of ASD,ADHD combined type ,PTSD, and MDD.    Visit Diagnosis: No diagnosis found.  Past Psychiatric History:  NPT: 03/2017- Dx MDD,  OPT: none SA: per EMR attempted via hanging in high school, patient reports that she has tried other times, but doesn't remember what the attempts were Hx of self harm via headbanging and cutting OPT: Therapist: Romeo Apple, [facility] in the past  Has had ASD testing 05/2021- at [facility], "Based on the findings of the current evaluation, Chloe Matthews meets the DSM-5 diagnostic criteria for Autism Spectrum Disorder (ASD)....Chloe Matthews's overall intellectual ability is within the Average range. However, she was noted to have weaknesses in working memory and other executive function skills such as cognitive flexibility, emotion regulation, inhibition, initiation, and planning and organization. These executive function deficits are consistent with diagnoses of ASD and ADHD.She will likely need assistance in meeting the naturally occurring demands of the everyday environment. The behavioral symptoms of depression were reported to be present at a clinically significant level, which is consistent with her previous diagnosis of major depressive disorder.  " Meds in past: Lexapro, Hydrozyxine 25mg , Celexa, Trazodone (oversedated), reports that when she ran out and could not find a provider she had really bad anxiety  05/2022- Started patient on Ritalin LA 20mg  for ADHD    Past Medical History:  Past Medical History:  Diagnosis Date   ADHD (attention deficit hyperactivity disorder)    Anxiety and depression    Asthma    prn inhaler   Autism spectrum disorder    Lateral malleolar fracture 05/25/2016   right   PTSD (post-traumatic stress disorder)     Past Surgical History:  Procedure  Laterality Date   ORIF ANKLE FRACTURE Right 06/10/2016   Procedure: OPEN REDUCTION INTERNAL FIXATION (ORIF) RIGHT ANKLE FRACTURE;  Surgeon: Toni Arthurs, MD;  Location: [facility];  Service: Orthopedics;  Laterality: Right;    Family Psychiatric History:   Sister: Bipolar (lithium) P GMA: Schizophrenia P Aunts: Bipolar Mom: ADHD and Autism  Family History:  Family History  Problem Relation Age of Onset   Hypertension Mother    Heart disease Mother        MI   Stroke Mother    Hypertension Maternal Grandmother    Stroke Maternal Grandmother    Heart disease Maternal Grandfather        MI   Depression Father     Social History:  Social History   Socioeconomic History   Marital status: Single    Spouse name: Not on file   Number of children: 0   Years of education: college student   Highest education level: Not on file  Occupational History   Occupation: Consulting civil engineer at [employer] in [place]    Comment: major Health and safety inspector  Tobacco Use   Smoking status: Never    Passive exposure: Yes   Smokeless tobacco: Never   Tobacco comments:    stepfather smoked outside  [employer] Use   Vaping Use: Never used  Substance and Sexual Activity   Alcohol use: No   Drug use: No   Sexual activity: Yes    Birth control/protection: Implant  Other Topics Concern   Not on file  Social History Narrative   Lives with her maternal grandmother.   Right-handed.   No  daily caffeine use.   Social Determinants of Health   Financial Resource Strain: Not on file  Food Insecurity: Not on file  Transportation Needs: Not on file  Physical Activity: Not on file  Stress: Not on file  Social Connections: Not on file    Allergies:  Allergies  Allergen Reactions   Bee Pollen    Drug Class [Trazodone And Nefazodone]     Pt said she had a hard time being waking up   Pollen Extract     Metabolic Disorder Labs: Lab Results  Component Value Date   HGBA1C 5.4 04/03/2020    MPG 108.28 04/03/2020   No results found for: "PROLACTIN" Lab Results  Component Value Date   CHOL 187 04/03/2020   TRIG 68 04/03/2020   HDL 80 04/03/2020   CHOLHDL 2.3 04/03/2020   VLDL 14 04/03/2020   LDLCALC 93 04/03/2020   Lab Results  Component Value Date   TSH 2.297 04/03/2020   TSH 1.570 06/28/2019    Therapeutic Level Labs: No results found for: "LITHIUM" No results found for: "VALPROATE" No results found for: "CBMZ"  Current Medications: Current Outpatient Medications  Medication Sig Dispense Refill   albuterol (VENTOLIN HFA) 108 (90 Base) MCG/ACT inhaler Inhale into the lungs every 6 (six) hours as needed for wheezing or shortness of breath.     loratadine (CLARITIN) 10 MG tablet Take 10 mg by mouth daily.     methylphenidate (RITALIN LA) 20 MG 24 hr capsule Take 1 capsule (20 mg total) by mouth daily. 30 capsule 0   No current facility-administered medications for this visit.     Musculoskeletal: Strength & Muscle Tone: {desc; muscle tone:32375} Gait & Station: {PE GAIT ED ZOXW:96045} Patient leans: {Patient Leans:21022755}  Psychiatric Specialty Exam: Review of Systems  There were no vitals taken for this visit.There is no height or weight on file to calculate BMI.  General Appearance: {Appearance:22683}  Eye Contact:  {BHH EYE CONTACT:22684}  Speech:  {Speech:22685}  Volume:  {Volume (PAA):22686}  Mood:  {BHH MOOD:22306}  Affect:  {Affect (PAA):22687}  Thought Process:  {Thought Process (PAA):22688}  Orientation:  {BHH ORIENTATION (PAA):22689}  Thought Content: {Thought Content:22690}   Suicidal Thoughts:  {ST/HT (PAA):22692}  Homicidal Thoughts:  {ST/HT (PAA):22692}  Memory:  {BHH MEMORY:22881}  Judgement:  {Judgement (PAA):22694}  Insight:  {Insight (PAA):22695}  Psychomotor Activity:  {Psychomotor (PAA):22696}  Concentration:  {Concentration:21399}  Recall:  {BHH GOOD/FAIR/POOR:22877}  Fund of Knowledge: {BHH GOOD/FAIR/POOR:22877}   Language: {BHH GOOD/FAIR/POOR:22877}  Akathisia:  {BHH YES OR NO:22294}  Handed:  {Handed:22697}  AIMS (if indicated): {Desc; done/not:10129}  Assets:  {Assets (PAA):22698}  ADL's:  {BHH WUJ'W:11914}  Cognition: {chl bhh cognition:304700322}  Sleep:  {BHH GOOD/FAIR/POOR:22877}   Screenings: AIMS    Flowsheet Row [facility]  AIMS Total Score 0      Flowsheet Row ED from 04/26/2022 in [facility]) ED from 04/03/2020 in [facility]  C-SSRS RISK CATEGORY No Risk High Risk        Assessment and Plan: ***  Collaboration of Care: Collaboration of Care: [facility] OP Collaboration of NWGN:[id number]}  Patient/Guardian was advised Release of Information must be obtained prior to any record release in order to collaborate their care with an outside provider. Patient/Guardian was advised if they have not already done so to contact the registration department to sign all necessary forms in order for [place] to release information  regarding their care.   Consent: Patient/Guardian gives verbal consent for treatment and assignment of benefits for services provided during this visit. Patient/Guardian expressed understanding and agreed to proceed.    Bobbye Morton, MD 07/07/2022, 1:00 PM

## 2022-12-09 DIAGNOSIS — O26891 Other specified pregnancy related conditions, first trimester: Secondary | ICD-10-CM | POA: Diagnosis not present

## 2022-12-09 DIAGNOSIS — Z3A01 Less than 8 weeks gestation of pregnancy: Secondary | ICD-10-CM | POA: Diagnosis not present

## 2022-12-09 DIAGNOSIS — R109 Unspecified abdominal pain: Secondary | ICD-10-CM | POA: Diagnosis not present

## 2022-12-09 DIAGNOSIS — Z6741 Type O blood, Rh negative: Secondary | ICD-10-CM | POA: Diagnosis not present

## 2022-12-09 DIAGNOSIS — Z3A10 10 weeks gestation of pregnancy: Secondary | ICD-10-CM | POA: Diagnosis not present

## 2022-12-09 DIAGNOSIS — O209 Hemorrhage in early pregnancy, unspecified: Secondary | ICD-10-CM | POA: Diagnosis not present

## 2022-12-09 DIAGNOSIS — N939 Abnormal uterine and vaginal bleeding, unspecified: Secondary | ICD-10-CM | POA: Diagnosis not present

## 2023-02-02 NOTE — L&D Delivery Note (Signed)
 Delivery Note Delivered precipitously At 7:41 AM a viable and healthy female was delivered via Vaginal, Spontaneous (Presentation: Right Occiput Anterior).  APGAR: , ; weight  .   Placenta status: Spontaneous, Intact.  Cord: 3 vessels with the following complications:  .  Anesthesia: Epidural Episiotomy: None Lacerations: None Suture Repair: none Est. Blood Loss (mL):  125  Mom to postpartum.  Baby to Couplet care / Skin to Skin.  Holmes Lusher 06/26/2023, 7:55 AM

## 2023-05-17 ENCOUNTER — Inpatient Hospital Stay (HOSPITAL_COMMUNITY)

## 2023-05-17 ENCOUNTER — Encounter: Payer: Self-pay | Admitting: Family Medicine

## 2023-05-17 ENCOUNTER — Inpatient Hospital Stay (HOSPITAL_COMMUNITY)
Admission: AD | Admit: 2023-05-17 | Discharge: 2023-05-17 | Disposition: A | Discharge status: Home / Self Care | Attending: Obstetrics & Gynecology | Admitting: Obstetrics & Gynecology

## 2023-05-17 ENCOUNTER — Telehealth: Payer: Self-pay | Admitting: Family Medicine

## 2023-05-17 ENCOUNTER — Encounter (HOSPITAL_COMMUNITY): Payer: Self-pay | Admitting: Obstetrics & Gynecology

## 2023-05-17 ENCOUNTER — Other Ambulatory Visit: Payer: Self-pay

## 2023-05-17 DIAGNOSIS — Z3A33 33 weeks gestation of pregnancy: Secondary | ICD-10-CM

## 2023-05-17 DIAGNOSIS — E669 Obesity, unspecified: Secondary | ICD-10-CM

## 2023-05-17 DIAGNOSIS — K5901 Slow transit constipation: Secondary | ICD-10-CM

## 2023-05-17 DIAGNOSIS — O26893 Other specified pregnancy related conditions, third trimester: Secondary | ICD-10-CM | POA: Diagnosis not present

## 2023-05-17 DIAGNOSIS — Z369 Encounter for antenatal screening, unspecified: Secondary | ICD-10-CM | POA: Insufficient documentation

## 2023-05-17 DIAGNOSIS — O99213 Obesity complicating pregnancy, third trimester: Secondary | ICD-10-CM

## 2023-05-17 DIAGNOSIS — O0933 Supervision of pregnancy with insufficient antenatal care, third trimester: Secondary | ICD-10-CM | POA: Insufficient documentation

## 2023-05-17 DIAGNOSIS — Z3A32 32 weeks gestation of pregnancy: Secondary | ICD-10-CM | POA: Insufficient documentation

## 2023-05-17 DIAGNOSIS — Z3689 Encounter for other specified antenatal screening: Secondary | ICD-10-CM

## 2023-05-17 LAB — RAPID HIV SCREEN (HIV 1/2 AB+AG)
HIV 1/2 Antibodies: NONREACTIVE
HIV-1 P24 Antigen - HIV24: NONREACTIVE

## 2023-05-17 LAB — CBC
HCT: 35.2 % — ABNORMAL LOW (ref 36.0–46.0)
Hemoglobin: 11.4 g/dL — ABNORMAL LOW (ref 12.0–15.0)
MCH: 27.3 pg (ref 26.0–34.0)
MCHC: 32.4 g/dL (ref 30.0–36.0)
MCV: 84.4 fL (ref 80.0–100.0)
Platelets: 300 10*3/uL (ref 150–400)
RBC: 4.17 MIL/uL (ref 3.87–5.11)
RDW: 12.4 % (ref 11.5–15.5)
WBC: 10.9 10*3/uL — ABNORMAL HIGH (ref 4.0–10.5)
nRBC: 0 % (ref 0.0–0.2)

## 2023-05-17 LAB — DIFFERENTIAL
Abs Immature Granulocytes: 0.06 10*3/uL (ref 0.00–0.07)
Basophils Absolute: 0.1 10*3/uL (ref 0.0–0.1)
Basophils Relative: 1 %
Eosinophils Absolute: 0.1 10*3/uL (ref 0.0–0.5)
Eosinophils Relative: 1 %
Immature Granulocytes: 1 %
Lymphocytes Relative: 18 %
Lymphs Abs: 1.9 10*3/uL (ref 0.7–4.0)
Monocytes Absolute: 0.9 10*3/uL (ref 0.1–1.0)
Monocytes Relative: 8 %
Neutro Abs: 7.8 10*3/uL — ABNORMAL HIGH (ref 1.7–7.7)
Neutrophils Relative %: 71 %

## 2023-05-17 LAB — URINALYSIS, ROUTINE W REFLEX MICROSCOPIC
Bilirubin Urine: NEGATIVE
Glucose, UA: NEGATIVE mg/dL
Hgb urine dipstick: NEGATIVE
Ketones, ur: NEGATIVE mg/dL
Leukocytes,Ua: NEGATIVE
Nitrite: NEGATIVE
Protein, ur: NEGATIVE mg/dL
Specific Gravity, Urine: 1.01 (ref 1.005–1.030)
pH: 7 (ref 5.0–8.0)

## 2023-05-17 LAB — RPR: RPR Ser Ql: NONREACTIVE

## 2023-05-17 LAB — TYPE AND SCREEN
ABO/RH(D): O NEG
Antibody Screen: NEGATIVE

## 2023-05-17 LAB — HEPATITIS B SURFACE ANTIGEN: Hepatitis B Surface Ag: NONREACTIVE

## 2023-05-17 LAB — WET PREP, GENITAL
Clue Cells Wet Prep HPF POC: NONE SEEN
Sperm: NONE SEEN
Trich, Wet Prep: NONE SEEN
WBC, Wet Prep HPF POC: >=10 — AB (ref <10)
Yeast Wet Prep HPF POC: NONE SEEN

## 2023-05-17 LAB — HIV ANTIBODY (ROUTINE TESTING W REFLEX): HIV Screen 4th Generation wRfx: NONREACTIVE

## 2023-05-17 MED ORDER — PREPLUS 27-1 MG PO TABS: 1.0000 | ORAL_TABLET | Freq: Every day | ORAL | 13 refills | Status: AC

## 2023-05-17 NOTE — MAU Note (Signed)
 Chloe Matthews is a 23 y.o. at Unknown here in MAU reporting: she having difficulty passing stool, states it hurts to pass, last BM yesterday.  Reports had difficulty passing stool secondary the size.  Reports she has purchased stool softeners, suppositories, and a laxative but hasn't tried the laxative.    Reports no PNC with the pregnancy.  States had two ultrasounds and they were both normal, decided no to get [facility] secondary normal ultrasounds.  LMP: 08/31/2022 Onset of complaint: March 2025 Pain score: 0 Vitals:   05/17/23 0935  BP: 127/88  Pulse: 94  Resp: 18  Temp: (!) 97.3 F (36.3 C)  SpO2: 100%     FHT: 140 bpm  Lab orders placed from triage: UA

## 2023-05-17 NOTE — Telephone Encounter (Signed)
 Called patient to setup [facility] from inbasket messages. There was no answer and VM was full to leave a message.

## 2023-05-17 NOTE — MAU Provider Note (Signed)
 History     CSN: [id number]  Arrival date and time: 05/17/23 0907   Event Date/Time   First Provider Initiated Contact with Patient 05/17/23 0957      Chief Complaint  Patient presents with   Constipation   Patient is 23 y.o. G1P0 [redacted]w[redacted]d here with complaints of constipation. She reports her last BM was yesterday. She reports she will go long stretches without BM. She noted some blood in the toilet after a BM. She reports BMs have been small and hard. She is having to push hard to have them. She has had a history of constipation.   +FM, denies LOF, VB, contractions, vaginal discharge.   Constipation Pertinent negatives include no abdominal pain, back pain, diarrhea, fever, nausea or vomiting.    OB History     Gravida  1   Para      Term      Preterm      AB      Living         SAB      IAB      Ectopic      Multiple      Live Births              Past Medical History:  Diagnosis Date   ADHD (attention deficit hyperactivity disorder)    Anxiety and depression    Asthma    prn inhaler, exercise induced   Autism spectrum disorder    Lateral malleolar fracture 05/25/2016   right   PTSD (post-traumatic stress disorder)     Past Surgical History:  Procedure Laterality Date   ORIF ANKLE FRACTURE Right 06/10/2016   Procedure: OPEN REDUCTION INTERNAL FIXATION (ORIF) RIGHT ANKLE FRACTURE;  Surgeon: Amada Backer, MD;  Location: [facility];  Service: Orthopedics;  Laterality: Right;    Family History  Problem Relation Age of Onset   Hypertension Mother    Heart disease Mother        MI   Stroke Mother    Hypertension Maternal Grandmother    Stroke Maternal Grandmother    Heart disease Maternal Grandfather        MI   Depression Father     Social History   Tobacco Use   Smoking status: Never    Passive exposure: Yes   Smokeless tobacco: Never   Tobacco comments:    stepfather smoked outside  Vaping Use   Vaping status: Some  Days  Substance Use Topics   Alcohol use: No   Drug use: No    Allergies:  Allergies  Allergen Reactions   Bee Pollen    Drug Class [Trazodone  And Nefazodone]     Pt said she had a hard time being waking up   Pollen Extract     Medications Prior to Admission  Medication Sig Dispense Refill Last Dose/Taking   albuterol (VENTOLIN HFA) 108 (90 Base) MCG/ACT inhaler Inhale into the lungs every 6 (six) hours as needed for wheezing or shortness of breath.      loratadine (CLARITIN) 10 MG tablet Take 10 mg by mouth daily.      methylphenidate  (RITALIN  LA) 20 MG 24 hr capsule Take 1 capsule (20 mg total) by mouth daily. 30 capsule 0     Review of Systems  Constitutional:  Negative for chills and fever.  HENT:  Negative for congestion and sore throat.   Eyes:  Negative for pain and visual disturbance.  Respiratory:  Negative for cough, chest tightness  and shortness of breath.   Cardiovascular:  Negative for chest pain.  Gastrointestinal:  Positive for constipation. Negative for abdominal pain, diarrhea, nausea and vomiting.  Endocrine: Negative for cold intolerance and heat intolerance.  Genitourinary:  Negative for dysuria and flank pain.  Musculoskeletal:  Negative for back pain.  Skin:  Negative for rash.  Allergic/Immunologic: Negative for food allergies.  Neurological:  Negative for dizziness and light-headedness.  Psychiatric/Behavioral:  Negative for agitation.    Physical Exam   Blood pressure 118/84, pulse 96, temperature (!) 97.3 F (36.3 C), temperature source Oral, resp. rate 18, height 5\' 6"  (1.676 m), weight 92.9 kg, last menstrual period 08/31/2022, SpO2 100%.  Physical Exam Vitals and nursing note reviewed.  Constitutional:      General: She is not in acute distress.    Appearance: She is well-developed.     Comments: Pregnant female  HENT:     Head: Normocephalic and atraumatic.  Eyes:     General: No scleral icterus.    Conjunctiva/sclera: Conjunctivae  normal.  Cardiovascular:     Rate and Rhythm: Normal rate.  Pulmonary:     Effort: Pulmonary effort is normal.  Chest:     Chest wall: No tenderness.  Abdominal:     Palpations: Abdomen is soft.     Tenderness: There is no abdominal tenderness. There is no guarding or rebound.     Comments: Gravid  Genitourinary:    Vagina: Normal.  Musculoskeletal:        General: Normal range of motion.     Cervical back: Normal range of motion and neck supple.  Skin:    General: Skin is warm and dry.     Findings: No rash.  Neurological:     Mental Status: She is alert and oriented to person, place, and time.    MAU Course  Procedures I reviewed the patient's fetal monitoring.  Baseline HR: 140 Variability:  moderate Accels:present Decels: none  A/P: Reactive NST  Reassured regarding fetal status.   MDM- low Declined MAU treatment of constipation. Prefers to treat at home  Assessment and Plan  Discharge home in stable condition Given constipation resources   Marine Sia [facility] 05/17/2023, 11:58 AM

## 2023-05-17 NOTE — Discharge Instructions (Signed)
 You have constipation which is hard stools that are difficult to pass. It is important to have regular bowel movements every 1-3 days that are soft and easy to pass. Hard stools increase your risk of hemorrhoids and are very uncomfortable.   To prevent constipation you can increase the amount of fiber in your diet. Examples of foods with fiber are leafy greens, whole grain breads, oatmeal and other grains.  It is also important to drink at least eight 8oz glass of water everyday.   If you have not has a bowel movement in 4-5 days you made need to clean out your bowel.  This will have establish normal movement through your bowel.    Miralax Clean out Take 8 capfuls of miralax in 64 oz of gatorade. You can use any fluid that appeals to you (gatorade, water, juice) Continue to drink at least eight 8 oz glasses of water throughout the day You can repeat with another 8 capfuls of miralax in 64 oz of gatorade if you are not having a large amount of stools You will need to be at home and close to a bathroom for about 8 hours when you do the above as you may need to go to the bathroom frequently.   After you are cleaned out (start at least one of these): - Start Colace100mg  twice daily (this is a tablet you can take) - Start Miralax 1/2 to 1 capful once daily - Start a daily fiber supplement like metamucil or citrucel - You can safely use enemas in pregnancy  - if you are having diarrhea you can reduce to Colace once a day or miralax every other day or a 1/2 capful daily.

## 2023-05-18 LAB — RUBELLA SCREEN: Rubella: 2.09 {index} (ref >0.99)

## 2023-05-18 LAB — GC/CHLAMYDIA PROBE AMP (~~LOC~~) NOT AT ARMC
Chlamydia: NEGATIVE
Comment: NEGATIVE
Comment: NORMAL
Neisseria Gonorrhea: NEGATIVE

## 2023-05-19 LAB — CULTURE, BETA STREP (GROUP B ONLY)

## 2023-05-21 ENCOUNTER — Encounter: Payer: Self-pay | Admitting: Family Medicine

## 2023-05-23 ENCOUNTER — Encounter (HOSPITAL_COMMUNITY): Payer: Self-pay | Admitting: Obstetrics and Gynecology

## 2023-05-23 ENCOUNTER — Inpatient Hospital Stay (HOSPITAL_COMMUNITY)
Admission: AD | Admit: 2023-05-23 | Discharge: 2023-05-24 | Disposition: A | Discharge status: Home / Self Care | Attending: Obstetrics and Gynecology | Admitting: Obstetrics and Gynecology

## 2023-05-23 ENCOUNTER — Other Ambulatory Visit: Payer: Self-pay

## 2023-05-23 DIAGNOSIS — Z3A33 33 weeks gestation of pregnancy: Secondary | ICD-10-CM | POA: Diagnosis not present

## 2023-05-23 DIAGNOSIS — O219 Vomiting of pregnancy, unspecified: Secondary | ICD-10-CM

## 2023-05-23 LAB — URINALYSIS, ROUTINE W REFLEX MICROSCOPIC
Bilirubin Urine: NEGATIVE
Glucose, UA: NEGATIVE mg/dL
Hgb urine dipstick: NEGATIVE
Ketones, ur: NEGATIVE mg/dL
Leukocytes,Ua: NEGATIVE
Nitrite: NEGATIVE
Protein, ur: NEGATIVE mg/dL
Specific Gravity, Urine: 1.019 (ref 1.005–1.030)
pH: 6 (ref 5.0–8.0)

## 2023-05-23 LAB — COMPREHENSIVE METABOLIC PANEL WITH GFR
ALT: 14 U/L (ref 0–44)
AST: 25 U/L (ref 15–41)
Albumin: 2.9 g/dL — ABNORMAL LOW (ref 3.5–5.0)
Alkaline Phosphatase: 140 U/L — ABNORMAL HIGH (ref 38–126)
Anion gap: 10 (ref 5–15)
BUN: 6 mg/dL (ref 6–20)
CO2: 19 mmol/L — ABNORMAL LOW (ref 22–32)
Calcium: 9.1 mg/dL (ref 8.9–10.3)
Chloride: 105 mmol/L (ref 98–111)
Creatinine, Ser: 0.56 mg/dL (ref 0.44–1.00)
GFR, Estimated: >60 mL/min (ref >60)
Glucose, Bld: 83 mg/dL (ref 70–99)
Potassium: 4.1 mmol/L (ref 3.5–5.1)
Sodium: 134 mmol/L — ABNORMAL LOW (ref 135–145)
Total Bilirubin: 0.7 mg/dL (ref 0.0–1.2)
Total Protein: 6.7 g/dL (ref 6.5–8.1)

## 2023-05-23 LAB — CBC
HCT: 37.5 % (ref 36.0–46.0)
Hemoglobin: 12.3 g/dL (ref 12.0–15.0)
MCH: 27.7 pg (ref 26.0–34.0)
MCHC: 32.8 g/dL (ref 30.0–36.0)
MCV: 84.5 fL (ref 80.0–100.0)
Platelets: 337 10*3/uL (ref 150–400)
RBC: 4.44 MIL/uL (ref 3.87–5.11)
RDW: 12.6 % (ref 11.5–15.5)
WBC: 10.9 10*3/uL — ABNORMAL HIGH (ref 4.0–10.5)
nRBC: 0 % (ref 0.0–0.2)

## 2023-05-23 MED ORDER — ONDANSETRON 4 MG PO TBDP: 4.0000 mg | ORAL_TABLET | Freq: Three times a day (TID) | ORAL | 0 refills | Status: AC | PRN

## 2023-05-23 MED ORDER — LACTATED RINGERS IV BOLUS
1000.0000 mL | Freq: Once | INTRAVENOUS | Status: AC
  Administered 2023-05-23: 1000 mL via INTRAVENOUS

## 2023-05-23 MED ORDER — ONDANSETRON HCL 4 MG/2ML IJ SOLN
4.0000 mg | Freq: Once | INTRAMUSCULAR | Status: AC
  Administered 2023-05-23: 4 mg via INTRAVENOUS
  Filled 2023-05-23: qty 2

## 2023-05-23 NOTE — Discharge Instructions (Signed)
 It was a pleasure taking care of you today.  Glad you are feeling better.  I sent a prescription for Zofran  to your pharmacy which you can let dissolve in your cheek.  If your symptoms worsen or you have any other concerns please return for further evaluation.  I be able wonderful night!

## 2023-05-23 NOTE — MAU Provider Note (Signed)
 History     CSN: [id number]  Arrival date and time: 05/23/23 2132   None     Chief Complaint  Patient presents with   Nausea   Emesis   HPI Patient is a 23 year old G1 at 33 weeks 3 days presenting for nausea and vomiting without being able to keep any thing solid or liquid down for the last 24 hours.  She reports no fever, chills, body aches, diarrhea, constipation, congestion,'s cold symptoms.  She has also been unable to sleep for the last 24 hours.  She denies any vaginal bleeding or gush of fluid.  Denies any contractions.  Has good fetal movement.  OB History     Gravida  1   Para      Term      Preterm      AB      Living         SAB      IAB      Ectopic      Multiple      Live Births              Past Medical History:  Diagnosis Date   ADHD (attention deficit hyperactivity disorder)    Anxiety and depression    Asthma    prn inhaler, exercise induced   Autism spectrum disorder    Lateral malleolar fracture 05/25/2016   right   PTSD (post-traumatic stress disorder)     Past Surgical History:  Procedure Laterality Date   ORIF ANKLE FRACTURE Right 06/10/2016   Procedure: OPEN REDUCTION INTERNAL FIXATION (ORIF) RIGHT ANKLE FRACTURE;  Surgeon: Amada Backer, MD;  Location: [facility];  Service: Orthopedics;  Laterality: Right;    Family History  Problem Relation Age of Onset   Hypertension Mother    Heart disease Mother        MI   Stroke Mother    Hypertension Maternal Grandmother    Stroke Maternal Grandmother    Heart disease Maternal Grandfather        MI   Depression Father     Social History   Tobacco Use   Smoking status: Never    Passive exposure: Yes   Smokeless tobacco: Never   Tobacco comments:    stepfather smoked outside  Vaping Use   Vaping status: Some Days  Substance Use Topics   Alcohol use: No   Drug use: No    Allergies:  Allergies  Allergen Reactions   Bee Pollen    Drug Class  [Trazodone  And Nefazodone]     Pt said she had a hard time being waking up   Pollen Extract     Medications Prior to Admission  Medication Sig Dispense Refill Last Dose/Taking   Prenatal Vit-Fe Fumarate-FA (PREPLUS) 27-1 MG TABS Take 1 tablet by mouth daily. 30 tablet 13 Past Week   albuterol  (VENTOLIN  HFA) 108 (90 Base) MCG/ACT inhaler Inhale into the lungs every 6 (six) hours as needed for wheezing or shortness of breath.   More than a month   loratadine  (CLARITIN ) 10 MG tablet Take 10 mg by mouth daily.   More than a month   methylphenidate  (RITALIN  LA) 20 MG 24 hr capsule Take 1 capsule (20 mg total) by mouth daily. 30 capsule 0     Review of Systems  Constitutional:  Negative for chills and fever.  HENT:  Negative for congestion and rhinorrhea.   Respiratory:  Negative for shortness of breath.   Gastrointestinal:  Positive for nausea and vomiting. Negative for abdominal pain.  Endocrine: Negative for polyuria.  Genitourinary:  Negative for vaginal bleeding and vaginal discharge.  Neurological:  Negative for headaches.   Physical Exam   Last menstrual period 08/31/2022.  Physical Exam Vitals reviewed.  Constitutional:      Appearance: She is ill-appearing.  HENT:     Head: Normocephalic and atraumatic.     Nose: Nose normal. No congestion or rhinorrhea.  Eyes:     Pupils: Pupils are equal, round, and reactive to light.  Cardiovascular:     Rate and Rhythm: Normal rate.  Pulmonary:     Effort: Pulmonary effort is normal.  Abdominal:     Palpations: Abdomen is soft.     Tenderness: There is no abdominal tenderness.     Comments: Gravid  Musculoskeletal:        General: Normal range of motion.     Cervical back: Normal range of motion.  Skin:    General: Skin is warm and dry.     Capillary Refill: Capillary refill takes less than 2 seconds.  Neurological:     General: No focal deficit present.     Mental Status: She is alert.     Cranial Nerves: No cranial nerve  deficit.     Sensory: No sensory deficit.    MAU Course  Procedures  MDM Urinalysis CBC CMP   Assessment and Plan  Emari E Petrovich is a 23 year old G1 at 35 weeks presenting for nausea and vomiting.  Nausea and vomiting Unclear etiology for the nausea and vomiting.  Patient reports she has been unable to keep anything down by mouth for the last 24 hours.  On arrival patient given fluid bolus as well as IV Zofran .  Initially still unable to keep liquids down but after continued observation in full liter bolus of fluids patient able to tolerate p.o. liquids without difficulty.  CMP showing hyponatremia of 134 but otherwise within normal limits.  CBC showing mildly elevated WBC at 10.9 but otherwise within normal limits.  Urinalysis showing no abnormalities.  On reevaluation patient reports she is feeling much better and would like to go home.  Prescription sent to patient's pharmacy for ODT Zofran  and strict return precautions given.  No further questions or concerns.  Kyo Cocuzza V Mialani Reicks 05/23/2023, 10:59 PM

## 2023-05-23 NOTE — MAU Note (Signed)
.  Chloe Matthews is a 23 y.o. at [redacted]w[redacted]d here in MAU reporting: unable to eat or drink x 24 hours-solid food and liquids- Denies fever, chills, body aches, diarrhea, congestion or cold symptom. Also reports being unable to sleep x 24 hours Denies vaginal bleeding, bloody show, or discharge. Endorses + fetal movement  Onset of complaint:  Pain score: 0300 Vitals:   05/23/23 2353  BP: 112/71  Pulse: 77     FHT: obtained in room on floor  Lab orders placed from triage: see orders

## 2023-05-24 DIAGNOSIS — O219 Vomiting of pregnancy, unspecified: Secondary | ICD-10-CM | POA: Diagnosis not present

## 2023-05-30 ENCOUNTER — Encounter: Admitting: Family Medicine

## 2023-06-17 ENCOUNTER — Ambulatory Visit: Admitting: Obstetrics and Gynecology

## 2023-06-17 ENCOUNTER — Other Ambulatory Visit: Payer: Self-pay

## 2023-06-17 ENCOUNTER — Encounter: Payer: Self-pay | Admitting: Obstetrics and Gynecology

## 2023-06-17 VITALS — BP 147/96 | HR 85 | Wt 222.8 lb

## 2023-06-17 DIAGNOSIS — Z1332 Encounter for screening for maternal depression: Secondary | ICD-10-CM

## 2023-06-17 DIAGNOSIS — Z3A37 37 weeks gestation of pregnancy: Secondary | ICD-10-CM

## 2023-06-17 DIAGNOSIS — O163 Unspecified maternal hypertension, third trimester: Secondary | ICD-10-CM

## 2023-06-17 DIAGNOSIS — Z3403 Encounter for supervision of normal first pregnancy, third trimester: Secondary | ICD-10-CM | POA: Insufficient documentation

## 2023-06-17 DIAGNOSIS — O169 Unspecified maternal hypertension, unspecified trimester: Secondary | ICD-10-CM | POA: Insufficient documentation

## 2023-06-17 DIAGNOSIS — O0933 Supervision of pregnancy with insufficient antenatal care, third trimester: Secondary | ICD-10-CM

## 2023-06-17 NOTE — Progress Notes (Signed)
 INITIAL PRENATAL VISIT NOTE  Subjective:  Chloe Matthews is a 23 y.o. G1P0000 at [redacted]w[redacted]d by third trimester ultrasound being seen today for her initial prenatal visit. The patient has not had any prenatal care this pregnancy other than a dating/anatomy scan at MAU at 33 weeks.  She has an obstetric history significant for primiparity. She has a medical history significant for ADHD and possible autism.  Patient reports occasional contractions.  Contractions: Irritability. Vag. Bleeding: None.  Movement: Present. Denies leaking of fluid.   BP noted to be elevated x 3.  She denies headache, visual changes and RUQ pain.   Past Medical History:  Diagnosis Date   ADHD (attention deficit hyperactivity disorder)    Anxiety and depression    Asthma    prn inhaler, exercise induced   Autism spectrum disorder    Lateral malleolar fracture 05/25/2016   right   PTSD (post-traumatic stress disorder)     Past Surgical History:  Procedure Laterality Date   ORIF ANKLE FRACTURE Right 06/10/2016   Procedure: OPEN REDUCTION INTERNAL FIXATION (ORIF) RIGHT ANKLE FRACTURE;  Surgeon: Amada Backer, MD;  Location: [facility];  Service: Orthopedics;  Laterality: Right;    OB History  Gravida Para Term Preterm AB Living  1 0 0 0 0 0  SAB IAB Ectopic Multiple Live Births  0 0 0 0 0    # Outcome Date GA Lbr Len/2nd Weight Sex Type Anes PTL Lv  1 Current             Social History   Socioeconomic History   Marital status: Single    Spouse name: Not on file   Number of children: 0   Years of education: college student   Highest education level: Not on file  Occupational History   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   No daily caffeine use.   Social Drivers of Corporate investment banker Strain: Not on file  Food Insecurity: Not on file  Transportation Needs: Not on file  Physical Activity: Not on file  Stress: Not on file  Social Connections: Not on file    Family History  Problem Relation Age of Onset   Hypertension Mother    Heart disease Mother        MI   Stroke Mother    Hypertension Maternal Grandmother    Stroke Maternal Grandmother    Heart disease Maternal Grandfather        MI   Depression Father      Current Outpatient Medications:    albuterol (VENTOLIN HFA) 108 (90 Base) MCG/ACT inhaler, Inhale into the lungs every 6 (six) hours as needed for wheezing or shortness of breath., Disp: , Rfl:    loratadine (CLARITIN) 10 MG tablet, Take 10 mg by mouth daily., Disp: , Rfl:    ondansetron  (ZOFRAN -ODT) 4 MG disintegrating tablet, Take 1 tablet (4 mg total) by mouth every 8 (eight) hours as needed for  nausea or vomiting., Disp: 20 tablet, Rfl: 0   Prenatal Vit-Fe Fumarate-FA (PREPLUS) 27-1 MG TABS, Take 1 tablet by mouth daily., Disp: 30 tablet, Rfl: 13  Allergies  Allergen Reactions   Bee Pollen    Drug Class [Trazodone  And Nefazodone]     Pt said she had a hard time being waking up   Pollen Extract     Review of Systems: Negative except for what is mentioned in HPI.  Objective:   Vitals:   06/17/23 0942 06/17/23 1011 06/17/23 1020  BP: (!) 140/96 (!) 143/103 (!) 147/96  Pulse: 85 92 85  Weight: 222 lb 12.8 oz (101.1 kg)      Fetal Status: Fetal Heart Rate (bpm): 136   Movement: Present     Physical Exam: BP (!) 147/96   Pulse 85   Wt 222 lb 12.8 oz (101.1 kg)   LMP 08/31/2022 (Approximate)   BMI 35.96 kg/m  CONSTITUTIONAL: Well-developed, well-nourished female in no acute distress.   NEUROLOGIC: Alert and oriented to person, place, and time. Normal reflexes, muscle tone coordination. No cranial nerve deficit noted.  DTR 2+ at biceps tendon.  Slightly inappropriate affect PSYCHIATRIC: Normal mood and affect. Normal behavior. Normal judgment and thought content. SKIN: Skin is warm and dry. No rash noted. Not diaphoretic. No erythema. No pallor. HENT:  Normocephalic, atraumatic, External right and left ear normal. Oropharynx is clear and moist EYES: Conjunctivae and EOM are normal. Pupils are equal, round, and reactive to light. No scleral icterus.  NECK: Normal range of motion, supple, no masses CARDIOVASCULAR: Normal heart rate noted, regular rhythm RESPIRATORY: Effort and breath sounds normal, no problems with respiration noted BREASTS: deferred ABDOMEN: Soft, nontender, nondistended, gravid. GU: deferred MUSCULOSKELETAL: Normal range of motion. EXT:  No edema and no tenderness. 2+ distal pulses.   Assessment and Plan:  Pregnancy: G1P0000 at [redacted]w[redacted]d by third trimester ultrasound  1. [redacted] weeks gestation of pregnancy (Primary)   2. Encounter for supervision of normal first pregnancy in third trimester Initial exam normal other than persistently elevated blood pressure  3. Late prenatal care affecting pregnancy in third trimester   4. Hypertension affecting pregnancy in third trimester BP elevated x 3.  Pt has no other symptoms.  Pt to MAU for serial BP and lab testing.  Pt and spouse made aware if BP remains elevated, she may need to be kept for delivery 2/2 GHTN at term     Term labor symptoms and general obstetric precautions including but not limited to vaginal bleeding, contractions, leaking of fluid and fetal movement were reviewed in detail with the patient.  Please refer to After Visit Summary for other counseling recommendations.   Return in about 1 week (around 06/24/2023) for [facility], in person.  Abigail Abler 06/17/2023 10:35 AM

## 2023-06-24 ENCOUNTER — Other Ambulatory Visit: Payer: Self-pay

## 2023-06-24 ENCOUNTER — Encounter (HOSPITAL_COMMUNITY): Payer: Self-pay | Admitting: Family Medicine

## 2023-06-24 ENCOUNTER — Inpatient Hospital Stay (HOSPITAL_COMMUNITY)
Admission: AD | Admit: 2023-06-24 | Discharge: 2023-06-28 | DRG: 806 | Disposition: A | Discharge status: Home / Self Care | Attending: Obstetrics and Gynecology | Admitting: Obstetrics and Gynecology

## 2023-06-24 DIAGNOSIS — O0933 Supervision of pregnancy with insufficient antenatal care, third trimester: Secondary | ICD-10-CM

## 2023-06-24 DIAGNOSIS — F840 Autistic disorder: Secondary | ICD-10-CM | POA: Diagnosis present

## 2023-06-24 DIAGNOSIS — O9902 Anemia complicating childbirth: Secondary | ICD-10-CM | POA: Diagnosis present

## 2023-06-24 DIAGNOSIS — Z3A38 38 weeks gestation of pregnancy: Secondary | ICD-10-CM

## 2023-06-24 DIAGNOSIS — Z8249 Family history of ischemic heart disease and other diseases of the circulatory system: Secondary | ICD-10-CM

## 2023-06-24 DIAGNOSIS — O134 Gestational [pregnancy-induced] hypertension without significant proteinuria, complicating childbirth: Secondary | ICD-10-CM | POA: Diagnosis present

## 2023-06-24 DIAGNOSIS — O133 Gestational [pregnancy-induced] hypertension without significant proteinuria, third trimester: Principal | ICD-10-CM | POA: Diagnosis present

## 2023-06-24 DIAGNOSIS — O169 Unspecified maternal hypertension, unspecified trimester: Secondary | ICD-10-CM | POA: Diagnosis present

## 2023-06-24 LAB — COMPREHENSIVE METABOLIC PANEL WITH GFR
ALT: 13 U/L (ref 0–44)
AST: 22 U/L (ref 15–41)
Albumin: 2.6 g/dL — ABNORMAL LOW (ref 3.5–5.0)
Alkaline Phosphatase: 177 U/L — ABNORMAL HIGH (ref 38–126)
Anion gap: 11 (ref 5–15)
BUN: 6 mg/dL (ref 6–20)
CO2: 20 mmol/L — ABNORMAL LOW (ref 22–32)
Calcium: 8.9 mg/dL (ref 8.9–10.3)
Chloride: 107 mmol/L (ref 98–111)
Creatinine, Ser: 0.65 mg/dL (ref 0.44–1.00)
GFR, Estimated: >60 mL/min (ref >60)
Glucose, Bld: 122 mg/dL — ABNORMAL HIGH (ref 70–99)
Potassium: 3.5 mmol/L (ref 3.5–5.1)
Sodium: 138 mmol/L (ref 135–145)
Total Bilirubin: 0.4 mg/dL (ref 0.0–1.2)
Total Protein: 6.1 g/dL — ABNORMAL LOW (ref 6.5–8.1)

## 2023-06-24 LAB — CBC
HCT: 32.7 % — ABNORMAL LOW (ref 36.0–46.0)
Hemoglobin: 10.6 g/dL — ABNORMAL LOW (ref 12.0–15.0)
MCH: 27.2 pg (ref 26.0–34.0)
MCHC: 32.4 g/dL (ref 30.0–36.0)
MCV: 84.1 fL (ref 80.0–100.0)
Platelets: 316 10*3/uL (ref 150–400)
RBC: 3.89 MIL/uL (ref 3.87–5.11)
RDW: 14.1 % (ref 11.5–15.5)
WBC: 7.7 10*3/uL (ref 4.0–10.5)
nRBC: 0.3 % — ABNORMAL HIGH (ref 0.0–0.2)

## 2023-06-24 LAB — GROUP B STREP BY PCR: Group B strep by PCR: NEGATIVE

## 2023-06-24 LAB — PROTEIN / CREATININE RATIO, URINE
Creatinine, Urine: 337 mg/dL
Protein Creatinine Ratio: 0.18 mg/mg{creat} — ABNORMAL HIGH (ref 0.00–0.15)
Total Protein, Urine: 59 mg/dL

## 2023-06-24 MED ORDER — SOD CITRATE-CITRIC ACID 500-334 MG/5ML PO SOLN
30.0000 mL | ORAL | Status: DC | PRN
  Administered 2023-06-25: 30 mL via ORAL
  Filled 2023-06-24: qty 30

## 2023-06-24 MED ORDER — OXYCODONE-ACETAMINOPHEN 5-325 MG PO TABS: 2.0000 | ORAL_TABLET | ORAL | Status: DC | PRN

## 2023-06-24 MED ORDER — LACTATED RINGERS IV SOLN: 500.0000 mL | INTRAVENOUS | Status: AC | PRN

## 2023-06-24 MED ORDER — ACETAMINOPHEN 325 MG PO TABS: 650.0000 mg | ORAL_TABLET | ORAL | Status: DC | PRN

## 2023-06-24 MED ORDER — LIDOCAINE HCL (PF) 1 % IJ SOLN: 30.0000 mL | INTRAMUSCULAR | Status: DC | PRN

## 2023-06-24 MED ORDER — OXYCODONE-ACETAMINOPHEN 5-325 MG PO TABS: 1.0000 | ORAL_TABLET | ORAL | Status: DC | PRN

## 2023-06-24 MED ORDER — OXYTOCIN BOLUS FROM INFUSION
333.0000 mL | Freq: Once | INTRAVENOUS | Status: AC
  Administered 2023-06-26: 333 mL via INTRAVENOUS

## 2023-06-24 MED ORDER — FLEET ENEMA RE ENEM: 1.0000 | ENEMA | RECTAL | Status: DC | PRN

## 2023-06-24 MED ORDER — OXYTOCIN-SODIUM CHLORIDE 30-0.9 UT/500ML-% IV SOLN
2.5000 [IU]/h | INTRAVENOUS | Status: DC
  Administered 2023-06-26: 2.5 [IU]/h via INTRAVENOUS
  Filled 2023-06-24: qty 500

## 2023-06-24 MED ORDER — RHO D IMMUNE GLOBULIN 1500 UNIT/2ML IJ SOSY
300.0000 ug | PREFILLED_SYRINGE | Freq: Once | INTRAMUSCULAR | Status: DC
  Filled 2023-06-24: qty 2

## 2023-06-24 MED ORDER — LACTATED RINGERS IV SOLN: INTRAVENOUS | Status: DC

## 2023-06-24 MED ORDER — ONDANSETRON HCL 4 MG/2ML IJ SOLN
4.0000 mg | Freq: Four times a day (QID) | INTRAMUSCULAR | Status: DC | PRN
  Administered 2023-06-25 (×2): 4 mg via INTRAVENOUS
  Filled 2023-06-24 (×2): qty 2

## 2023-06-24 NOTE — MAU Note (Signed)
 MAU Triage Note:  .Chloe Matthews is a 23 y.o. at [redacted]w[redacted]d here in MAU reporting: HBP at home of 142/118. OB has been watching her BP and having her take them at home. Denies VB or LOF. Reports +FM.   PIH Assessment: Headache present: No  Visual disturbances: None RUQ pain/Epigastric: None Atypical edema: swelling on her left foot for the past several days. BP Medications: None prescribed   Patient complaint: HBP, 143 over 118, Nausea   Denies pain     Onset of complaint: today LMP: Patient's last menstrual period was 08/31/2022 (approximate).  Vitals:   06/24/23 2034  BP: (!) 132/94  Pulse: 87  Resp: 17  Temp: 97.8 F (36.6 C)  SpO2: 100%    FHT:   Deferred due to maternal dress Lab orders placed from triage: UA

## 2023-06-24 NOTE — H&P (Signed)
 Obstetric History and Physical  Chloe Matthews is a 23 y.o. G1P0000 with IUP at [redacted]w[redacted]d presenting for hypertension. Had first ob visit last week. Had new onset hypertension & was instructed to come to MAU for evaluation & possible admission at that time. She did not come in as she wanted to get to 38 wks before being delivered.  Persistent elevated BPs in MAU this evening. Denies headache, visual disturbance, or epigastric pain. Denies history of hypertension.    Prenatal Course Source of Care: [facility]  with onset of care at 37 weeks Pregnancy complications or risks: Patient Active Problem List   Diagnosis Date Noted   Encounter for supervision of normal first pregnancy in third trimester 06/17/2023   Late prenatal care affecting pregnancy in third trimester 06/17/2023   Hypertension affecting pregnancy 06/17/2023   Autism 04/26/2022   Attention deficit hyperactivity disorder (ADHD) 04/26/2022   She plans to breast & formula feed.  She desires undecided for postpartum contraception.   Prenatal labs and studies: ABO, Rh: --/--/O NEG (04/15 1018) Antibody: NEG (04/15 1018) Rubella: 2.09 (04/15 1022) RPR: NON REACTIVE (04/15 1022)  HBsAg: NON REACTIVE (04/15 1022)  HIV: NON REACTIVE, Non Reactive (04/15 1022)  GBS: negative 4/15 - recollected 5/23 2hr GTT:  not done, late to care Genetic screening not done Anatomy US  normal with limited views due to late gestational age  Prenatal Transfer Tool  Maternal Diabetes: No Genetic Screening: Declined Maternal Ultrasounds/Referrals: Normal Fetal Ultrasounds or other Referrals:  None Maternal Substance Abuse:  No Significant Maternal Medications:  None Significant Maternal Lab Results: Group B Strep negative  Past Medical History:  Diagnosis Date   ADHD (attention deficit hyperactivity disorder)    Anxiety and depression    Asthma    prn inhaler, exercise induced   Autism spectrum disorder    Chlamydia 03/24/2017   Lateral  malleolar fracture 05/25/2016   right   MDD (major depressive disorder), recurrent episode, severe (HCC) 03/22/2017   PTSD (post-traumatic stress disorder)     Past Surgical History:  Procedure Laterality Date   ORIF ANKLE FRACTURE Right 06/10/2016   Procedure: OPEN REDUCTION INTERNAL FIXATION (ORIF) RIGHT ANKLE FRACTURE;  Surgeon: Amada Backer, MD;  Location: [facility];  Service: Orthopedics;  Laterality: Right;    OB History  Gravida Para Term Preterm AB Living  1 0 0 0 0 0  SAB IAB Ectopic Multiple Live Births  0 0 0 0 0    # Outcome Date GA Lbr Len/2nd Weight Sex Type Anes PTL Lv  1 Current             Social History   Socioeconomic History   Marital status: Single    Spouse name: Not on file   Number of children: 0   Years of education: college student   Highest education level: Not on file  Occupational History   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   No daily caffeine use.  Social Drivers of Corporate investment banker Strain: Not on file  Food Insecurity: No Food Insecurity (06/24/2023)   Hunger Vital Sign    Worried About Running Out of Food in the Last Year: Never true    Ran Out of Food in the Last Year: Never true  Transportation Needs: No Transportation Needs (06/24/2023)   PRAPARE - Administrator, Civil Service (Medical): No    Lack of Transportation (Non-Medical): No  Physical Activity: Not on file  Stress: Not on file  Social Connections: Not on file    Family History  Problem Relation Age of  Onset   Hypertension Mother    Heart disease Mother        MI   Stroke Mother    Hypertension Maternal Grandmother    Stroke Maternal Grandmother    Heart disease Maternal Grandfather        MI   Depression Father     Medications Prior to Admission  Medication Sig Dispense Refill Last Dose/Taking   Prenatal Vit-Fe Fumarate-FA (PREPLUS) 27-1 MG TABS Take 1 tablet by mouth daily. 30 tablet 13 06/23/2023   albuterol (VENTOLIN HFA) 108 (90 Base) MCG/ACT inhaler Inhale into the lungs every 6 (six) hours as needed for wheezing or shortness of breath.   More than a month   loratadine (CLARITIN) 10 MG tablet Take 10 mg by mouth daily.   More than a month   ondansetron  (ZOFRAN -ODT) 4 MG disintegrating tablet Take 1 tablet (4 mg total) by mouth every 8 (eight) hours as needed for nausea or vomiting. 20 tablet 0 Unknown    Allergies  Allergen Reactions   Bee Pollen    Drug Class [Trazodone  And Nefazodone]     Pt said she had a hard time being waking up   Pollen Extract     Review of Systems: Negative except for what is mentioned in HPI.  Physical Exam: BP (!) 156/100 (BP Location: Right Arm)   Pulse 95   Temp 97.9 F (36.6 C) (Oral)   Resp 18   Ht 5\' 6"  (1.676 m)   Wt 102.6 kg   LMP 08/31/2022 (Approximate)   SpO2 100%   BMI 36.51 kg/m  CONSTITUTIONAL: Well-developed, well-nourished female in no acute distress.  HENT:  Normocephalic, atraumatic, External right and left ear normal. Oropharynx is clear and moist EYES: Conjunctivae and EOM are normal. Pupils are equal, round, and reactive to light. No scleral icterus.  NECK: Normal range of motion, supple, no masses SKIN: Skin is warm and dry. No rash noted. Not diaphoretic. No erythema. No pallor. NEUROLOGIC: Alert and oriented to person, place, and time. Normal reflexes, muscle tone coordination. No cranial nerve deficit noted. PSYCHIATRIC: Normal mood and affect. Normal behavior. Normal judgment and thought  content. CARDIOVASCULAR: Normal heart rate noted, regular rhythm RESPIRATORY: Effort and breath sounds normal, no problems with respiration noted ABDOMEN: Soft, nontender, nondistended, gravid. MUSCULOSKELETAL: Normal range of motion. No edema and no tenderness. 2+ distal pulses.  Cervical Exam: Dilation: Fingertip Effacement (%): Thick Station: -3 Exam by:: Bartlett Lighter, RN   Presentation: cephalic FHT:  NST:  Baseline: 130 bpm, Variability: Good {> 6 bpm), Accelerations: Reactive, and Decelerations: Absent   Pertinent Labs/Studies:   Results for orders placed or performed during the hospital encounter of 06/24/23 (from the past 24 hours)  Protein / creatinine ratio, urine     Status: Abnormal   Collection Time: 06/24/23  9:02 PM  Result Value Ref Range  Creatinine, Urine 337 mg/dL   Total Protein, Urine 59 mg/dL   Protein Creatinine Ratio 0.18 (H) 0.00 - 0.15 mg/mg[Cre]  CBC     Status: Abnormal   Collection Time: 06/24/23  9:27 PM  Result Value Ref Range   WBC 7.7 4.0 - 10.5 K/uL   RBC 3.89 3.87 - 5.11 MIL/uL   Hemoglobin 10.6 (L) 12.0 - 15.0 g/dL   HCT 16.1 (L) 09.6 - 04.5 %   MCV 84.1 80.0 - 100.0 fL   MCH 27.2 26.0 - 34.0 pg   MCHC 32.4 30.0 - 36.0 g/dL   RDW 40.9 81.1 - 91.4 %   Platelets 316 150 - 400 K/uL   nRBC 0.3 (H) 0.0 - 0.2 %  Comprehensive metabolic panel with GFR     Status: Abnormal   Collection Time: 06/24/23  9:27 PM  Result Value Ref Range   Sodium 138 135 - 145 mmol/L   Potassium 3.5 3.5 - 5.1 mmol/L   Chloride 107 98 - 111 mmol/L   CO2 20 (L) 22 - 32 mmol/L   Glucose, Bld 122 (H) 70 - 99 mg/dL   BUN 6 6 - 20 mg/dL   Creatinine, Ser 7.82 0.44 - 1.00 mg/dL   Calcium 8.9 8.9 - 95.6 mg/dL   Total Protein 6.1 (L) 6.5 - 8.1 g/dL   Albumin 2.6 (L) 3.5 - 5.0 g/dL   AST 22 15 - 41 U/L   ALT 13 0 - 44 U/L   Alkaline Phosphatase 177 (H) 38 - 126 U/L   Total Bilirubin 0.4 0.0 - 1.2 mg/dL   GFR, Estimated >21 >30 mL/min   Anion gap 11 5 - 15     Assessment : Chloe Matthews is a 23 y.o. G1P0000 at [redacted]w[redacted]d being admitted for induction of labor due to gestational hypertension.  Plan: Labor:  Induction as ordered as per protocol.  Analgesia as needed. FWB: Reassuring fetal heart tracing.   GBS negative (5+ weeks ago, recollected in MAU) Delivery plan: Hopeful for vaginal delivery   Terri Fester

## 2023-06-24 NOTE — Plan of Care (Signed)

## 2023-06-25 ENCOUNTER — Inpatient Hospital Stay (HOSPITAL_COMMUNITY): Admitting: Anesthesiology

## 2023-06-25 LAB — CBC
HCT: 33.7 % — ABNORMAL LOW (ref 36.0–46.0)
Hemoglobin: 10.7 g/dL — ABNORMAL LOW (ref 12.0–15.0)
MCH: 26.8 pg (ref 26.0–34.0)
MCHC: 31.8 g/dL (ref 30.0–36.0)
MCV: 84.5 fL (ref 80.0–100.0)
Platelets: 322 10*3/uL (ref 150–400)
RBC: 3.99 MIL/uL (ref 3.87–5.11)
RDW: 14 % (ref 11.5–15.5)
WBC: 10.5 10*3/uL (ref 4.0–10.5)
nRBC: 0 % (ref 0.0–0.2)

## 2023-06-25 LAB — TYPE AND SCREEN
ABO/RH(D): O NEG
Antibody Screen: NEGATIVE

## 2023-06-25 LAB — RPR: RPR Ser Ql: NONREACTIVE

## 2023-06-25 MED ORDER — LIDOCAINE HCL (PF) 1 % IJ SOLN
INTRAMUSCULAR | Status: DC | PRN
  Administered 2023-06-25 (×2): 4 mL via EPIDURAL

## 2023-06-25 MED ORDER — ALUM & MAG HYDROXIDE-SIMETH 200-200-20 MG/5ML PO SUSP
30.0000 mL | ORAL | Status: AC | PRN
  Administered 2023-06-25: 30 mL via ORAL
  Filled 2023-06-25: qty 30

## 2023-06-25 MED ORDER — DIPHENHYDRAMINE HCL 50 MG/ML IJ SOLN: 12.5000 mg | INTRAMUSCULAR | Status: DC | PRN

## 2023-06-25 MED ORDER — FENTANYL CITRATE (PF) 100 MCG/2ML IJ SOLN: 100.0000 ug | INTRAMUSCULAR | Status: DC | PRN

## 2023-06-25 MED ORDER — LACTATED RINGERS IV SOLN
500.0000 mL | Freq: Once | INTRAVENOUS | Status: AC
  Administered 2023-06-26: 500 mL via INTRAVENOUS

## 2023-06-25 MED ORDER — MISOPROSTOL 50MCG HALF TABLET
50.0000 ug | ORAL_TABLET | ORAL | Status: DC | PRN
  Administered 2023-06-25: 50 ug via ORAL
  Filled 2023-06-25: qty 1

## 2023-06-25 MED ORDER — TERBUTALINE SULFATE 1 MG/ML IJ SOLN: 0.2500 mg | Freq: Once | INTRAMUSCULAR | Status: DC | PRN

## 2023-06-25 MED ORDER — PHENYLEPHRINE 80 MCG/ML (10ML) SYRINGE FOR IV PUSH (FOR BLOOD PRESSURE SUPPORT): 80.0000 ug | PREFILLED_SYRINGE | INTRAVENOUS | Status: DC | PRN

## 2023-06-25 MED ORDER — FENTANYL CITRATE (PF) 100 MCG/2ML IJ SOLN
INTRAMUSCULAR | Status: AC
  Administered 2023-06-25: 100 ug via INTRAVENOUS
  Filled 2023-06-25: qty 2

## 2023-06-25 MED ORDER — OXYTOCIN-SODIUM CHLORIDE 30-0.9 UT/500ML-% IV SOLN
1.0000 m[IU]/min | INTRAVENOUS | Status: DC
  Administered 2023-06-25: 2 m[IU]/min via INTRAVENOUS
  Filled 2023-06-25: qty 500

## 2023-06-25 MED ORDER — ALUM & MAG HYDROXIDE-SIMETH 200-200-20 MG/5ML PO SUSP: 15.0000 mL | ORAL | Status: DC | PRN

## 2023-06-25 MED ORDER — EPHEDRINE 5 MG/ML INJ: 10.0000 mg | INTRAVENOUS | Status: DC | PRN

## 2023-06-25 MED ORDER — FENTANYL-BUPIVACAINE-NACL 0.5-0.125-0.9 MG/250ML-% EP SOLN
12.0000 mL/h | EPIDURAL | Status: DC | PRN
  Administered 2023-06-25 – 2023-06-26 (×2): 12 mL/h via EPIDURAL
  Filled 2023-06-25 (×2): qty 250

## 2023-06-25 NOTE — Anesthesia Preprocedure Evaluation (Signed)
 Anesthesia Evaluation  Patient identified by MRN, date of birth, ID band Patient awake    Reviewed: Allergy & Precautions, NPO status , Patient's Chart, lab work & pertinent test results  History of Anesthesia Complications Negative for: history of anesthetic complications  Airway Mallampati: III  TM Distance: >3 FB Neck ROM: Full    Dental   Pulmonary asthma    Pulmonary exam normal breath sounds clear to auscultation       Cardiovascular hypertension (gestational),  Rhythm:Regular Rate:Normal     Neuro/Psych  PSYCHIATRIC DISORDERS (PTSD, ADHD, autism spectrum disorder) Anxiety Depression    negative neurological ROS     GI/Hepatic negative GI ROS, Neg liver ROS,,,  Endo/Other  negative endocrine ROS    Renal/GU negative Renal ROS     Musculoskeletal   Abdominal   Peds  Hematology  (+) Blood dyscrasia, anemia Lab Results      Component                Value               Date                      WBC                      10.5                06/25/2023                HGB                      10.7 (L)            06/25/2023                HCT                      33.7 (L)            06/25/2023                MCV                      84.5                06/25/2023                PLT                      322                 06/25/2023              Anesthesia Other Findings   Reproductive/Obstetrics (+) Pregnancy                              Anesthesia Physical Anesthesia Plan  ASA: 2  Anesthesia Plan: Epidural   Post-op Pain Management:    Induction:   PONV Risk Score and Plan:   Airway Management Planned: Natural Airway  Additional Equipment:   Intra-op Plan:   Post-operative Plan:   Informed Consent: I have reviewed the patients History and Physical, chart, labs and discussed the procedure including the risks, benefits and alternatives for the proposed anesthesia with the  patient or authorized representative who has indicated his/her understanding and acceptance.  Plan Discussed with: Anesthesiologist  Anesthesia Plan Comments: (I have discussed risks of neuraxial anesthesia including but not limited to infection, bleeding, nerve injury, back pain, headache, seizures, and failure of block. Patient denies bleeding disorders and is not currently anticoagulated. Labs have been reviewed. Risks and benefits discussed. All patient's questions answered.  )         Anesthesia Quick Evaluation

## 2023-06-25 NOTE — Progress Notes (Signed)
 Patient ID: Chloe Matthews, female   DOB: December 31, 2000, 23 y.o.   MRN: [id number] Doing well  Vitals:   06/25/23 1945 06/25/23 2000 06/25/23 2030 06/25/23 2100  BP: (!) 142/94 (!) 148/94 133/82 138/80  Pulse: 75 80 74 75  Resp:      Temp:      TempSrc:      SpO2:      Weight:      Height:       FHR 130, fair to moderate variability, + accels, + early Decels (mirror contractions) UCs regular q2-32min  Dilation: 8.5 Effacement (%): 90 Cervical Position: Middle Station: -1, 0 Presentation: Vertex Exam by:: Renie Carver, RN  Anticipate SVD

## 2023-06-25 NOTE — Progress Notes (Signed)
 Chloe Matthews is a 23 y.o. G1P0000 at [redacted]w[redacted]d by ultrasound admitted for IOL for gHTN.   Subjective: Patient is now comfortable in bed with epidural.  Objective: BP (!) 128/95   Pulse 72   Temp 98.1 F (36.7 C) (Oral)   Resp 14   Ht 5\' 6"  (1.676 m)   Wt 102.6 kg   LMP 08/31/2022 (Approximate)   SpO2 100%   BMI 36.51 kg/m     FHT:  FHR: 125 bpm, variability: moderate,  accelerations:  Present,  decelerations:  Absent UC:   regular, every 2-4 minutes SVE:   Dilation: 5 Effacement (%): 60 Station: -2 Exam by:: Adline Hook, RN  Labs: Lab Results  Component Value Date   WBC 10.5 06/25/2023   HGB 10.7 (L) 06/25/2023   HCT 33.7 (L) 06/25/2023   MCV 84.5 06/25/2023   PLT 322 06/25/2023    Assessment / Plan: Induction of labor due to gHTN,  progressing well on pitocin. Cat 1 FHT  Labor: Progressing on Pitocin, option to AROM discussed with patient. She agrees with this plan but asked to wait for now. RN to notify me when ready. Preeclampsia:  BP and labs stable for now. Fetal Wellbeing:  Category I Pain Control:  Epidural I/D:  presumptive negative Anticipated MOD:  NSVD  Wilford Hanks, Student-MidWife 06/25/2023, 11:55 AM

## 2023-06-25 NOTE — Anesthesia Procedure Notes (Signed)
 Epidural Patient location during procedure: OB Start time: 06/25/2023 10:31 AM End time: 06/25/2023 10:36 AM  Staffing Anesthesiologist: Conard Decent, MD Performed: anesthesiologist   Preanesthetic Checklist Completed: patient identified, IV checked, site marked, risks and benefits discussed, surgical consent, monitors and equipment checked, pre-op evaluation and timeout performed  Epidural Patient position: sitting Prep: DuraPrep and site prepped and draped Patient monitoring: continuous pulse ox and blood pressure Approach: midline Location: L3-L4 Injection technique: LOR saline  Needle:  Needle type: Tuohy  Needle gauge: 17 G Needle length: 9 cm and 9 Needle insertion depth: 6 cm Catheter type: closed end flexible Catheter size: 19 Gauge Catheter at skin depth: 10 cm Test dose: negative  Assessment Events: blood not aspirated, no cerebrospinal fluid, injection not painful, no injection resistance, no paresthesia and negative IV test  Additional Notes The patient has requested an epidural for labor pain management. Risks and benefits including, but not limited to, infection, bleeding, local anesthetic toxicity, headache, hypotension, back pain, block failure, etc. were discussed with the patient. The patient expressed understanding and consented to the procedure. I confirmed that the patient has no bleeding disorders and is not taking blood thinners. I confirmed the patient's last platelet count with the nurse. A time-out was performed immediately prior to the procedure. Please see nursing documentation for vital signs. Sterile technique was used throughout the whole procedure. Once LOR achieved, the epidural catheter threaded easily without resistance. Aspiration of the catheter was negative for blood and CSF. The epidural was dosed slowly and an infusion was started.  1 attempt(s)Reason for block:procedure for pain

## 2023-06-25 NOTE — Progress Notes (Signed)
 Patient ID: Chloe Matthews, female   DOB: 2000-02-23, 23 y.o.   MRN: [id number]  BP 128/86   Pulse 70   Temp 98.1 F (36.7 C) (Oral)   Resp 16   Ht 5\' 6"  (1.676 m)   Wt 102.6 kg   LMP 08/31/2022 (Approximate)   SpO2 99%   BMI 36.51 kg/m   Dilation: 6.5 Effacement (%): 80 Cervical Position: Middle Station: -1 Presentation: Vertex Exam by:: Adline Hook, RN  Patient is comfortable in the bed with her epidural. She denies any pain or concerns at this time. She consented to AROM with clear fluid. Tolerated well. Continue pitocin titration. Cat 1 FHT.

## 2023-06-25 NOTE — Progress Notes (Signed)
 Labor progress note:  At bedside to meet pt. Discussed IOL in detail w pt. Pt open to FB and Cytotec. Cx FT/thick/high. Cook catheter placed, balloon inflated to 40cc. Pt tolerated well. Cytotec 50mcg buccal to also be given. BP mild range, no si/sx severe pre-e. Will cont to monitor.   Melanie Spires, MD OB Fellow, Faculty Practice [facility], [facility] for [facility]

## 2023-06-26 ENCOUNTER — Encounter (HOSPITAL_COMMUNITY): Payer: Self-pay | Admitting: Family Medicine

## 2023-06-26 DIAGNOSIS — O0933 Supervision of pregnancy with insufficient antenatal care, third trimester: Secondary | ICD-10-CM | POA: Diagnosis not present

## 2023-06-26 DIAGNOSIS — O134 Gestational [pregnancy-induced] hypertension without significant proteinuria, complicating childbirth: Secondary | ICD-10-CM

## 2023-06-26 DIAGNOSIS — Z3A38 38 weeks gestation of pregnancy: Secondary | ICD-10-CM | POA: Diagnosis not present

## 2023-06-26 LAB — CBC
HCT: 34.1 % — ABNORMAL LOW (ref 36.0–46.0)
Hemoglobin: 10.9 g/dL — ABNORMAL LOW (ref 12.0–15.0)
MCH: 27.3 pg (ref 26.0–34.0)
MCHC: 32 g/dL (ref 30.0–36.0)
MCV: 85.3 fL (ref 80.0–100.0)
Platelets: 290 10*3/uL (ref 150–400)
RBC: 4 MIL/uL (ref 3.87–5.11)
RDW: 14.3 % (ref 11.5–15.5)
WBC: 18.5 10*3/uL — ABNORMAL HIGH (ref 4.0–10.5)
nRBC: 0.1 % (ref 0.0–0.2)

## 2023-06-26 MED ORDER — POTASSIUM CHLORIDE CRYS ER 20 MEQ PO TBCR
40.0000 meq | EXTENDED_RELEASE_TABLET | Freq: Every day | ORAL | Status: DC
  Administered 2023-06-26 – 2023-06-28 (×3): 40 meq via ORAL
  Filled 2023-06-26 (×3): qty 2

## 2023-06-26 MED ORDER — COCONUT OIL OIL
1.0000 | TOPICAL_OIL | Status: DC | PRN
  Administered 2023-06-26: 1 via TOPICAL

## 2023-06-26 MED ORDER — SENNOSIDES-DOCUSATE SODIUM 8.6-50 MG PO TABS
2.0000 | ORAL_TABLET | Freq: Every day | ORAL | Status: DC
  Administered 2023-06-27 – 2023-06-28 (×2): 2 via ORAL
  Filled 2023-06-26 (×2): qty 2

## 2023-06-26 MED ORDER — PRENATAL MULTIVITAMIN CH
1.0000 | ORAL_TABLET | Freq: Every day | ORAL | Status: DC
  Administered 2023-06-26 – 2023-06-28 (×3): 1 via ORAL
  Filled 2023-06-26 (×3): qty 1

## 2023-06-26 MED ORDER — ACETAMINOPHEN 325 MG PO TABS
650.0000 mg | ORAL_TABLET | ORAL | Status: DC | PRN
  Filled 2023-06-26: qty 2

## 2023-06-26 MED ORDER — WITCH HAZEL-GLYCERIN EX PADS: 1.0000 | MEDICATED_PAD | CUTANEOUS | Status: DC | PRN

## 2023-06-26 MED ORDER — ONDANSETRON HCL 4 MG/2ML IJ SOLN: 4.0000 mg | INTRAMUSCULAR | Status: DC | PRN

## 2023-06-26 MED ORDER — FUROSEMIDE 20 MG PO TABS
20.0000 mg | ORAL_TABLET | Freq: Every day | ORAL | Status: DC
Start: 2023-06-26 — End: 2023-07-03
  Administered 2023-06-26 – 2023-06-28 (×3): 20 mg via ORAL
  Filled 2023-06-26 (×3): qty 1

## 2023-06-26 MED ORDER — IBUPROFEN 600 MG PO TABS
600.0000 mg | ORAL_TABLET | Freq: Four times a day (QID) | ORAL | Status: DC
  Administered 2023-06-26 – 2023-06-28 (×9): 600 mg via ORAL
  Filled 2023-06-26 (×9): qty 1

## 2023-06-26 MED ORDER — ZOLPIDEM TARTRATE 5 MG PO TABS: 5.0000 mg | ORAL_TABLET | Freq: Every evening | ORAL | Status: DC | PRN

## 2023-06-26 MED ORDER — SIMETHICONE 80 MG PO CHEW: 80.0000 mg | CHEWABLE_TABLET | ORAL | Status: DC | PRN

## 2023-06-26 MED ORDER — DIPHENHYDRAMINE HCL 25 MG PO CAPS: 25.0000 mg | ORAL_CAPSULE | Freq: Four times a day (QID) | ORAL | Status: DC | PRN

## 2023-06-26 MED ORDER — LORATADINE 10 MG PO TABS
10.0000 mg | ORAL_TABLET | Freq: Every day | ORAL | Status: DC
  Administered 2023-06-26 – 2023-06-28 (×3): 10 mg via ORAL
  Filled 2023-06-26 (×3): qty 1

## 2023-06-26 MED ORDER — BENZOCAINE-MENTHOL 20-0.5 % EX AERO
1.0000 | INHALATION_SPRAY | CUTANEOUS | Status: DC | PRN
  Filled 2023-06-26: qty 56

## 2023-06-26 MED ORDER — TETANUS-DIPHTH-ACELL PERTUSSIS 5-2.5-18.5 LF-MCG/0.5 IM SUSY: 0.5000 mL | PREFILLED_SYRINGE | Freq: Once | INTRAMUSCULAR | Status: DC

## 2023-06-26 MED ORDER — NIFEDIPINE ER OSMOTIC RELEASE 30 MG PO TB24
30.0000 mg | ORAL_TABLET | Freq: Every day | ORAL | Status: DC
Start: 2023-06-26 — End: 2023-06-28
  Administered 2023-06-26 – 2023-06-28 (×3): 30 mg via ORAL
  Filled 2023-06-26 (×3): qty 1

## 2023-06-26 MED ORDER — ALBUTEROL SULFATE (2.5 MG/3ML) 0.083% IN NEBU: 3.0000 mL | INHALATION_SOLUTION | Freq: Four times a day (QID) | RESPIRATORY_TRACT | Status: DC | PRN

## 2023-06-26 MED ORDER — ONDANSETRON HCL 4 MG PO TABS: 4.0000 mg | ORAL_TABLET | ORAL | Status: DC | PRN

## 2023-06-26 MED ORDER — DIBUCAINE (PERIANAL) 1 % EX OINT: 1.0000 | TOPICAL_OINTMENT | CUTANEOUS | Status: DC | PRN

## 2023-06-26 NOTE — Lactation Note (Signed)
 This note was copied from a baby's chart. Lactation Consultation Note  Patient Name: Chloe Matthews Today's Date: 06/26/2023 Age:25 hours Mom initially said no for lactation and per Tiburcio Fly mom has changed her mind and now would like to see LC.     Maternal Data         Richarda Chance 06/26/2023, 1:59 PM

## 2023-06-26 NOTE — Progress Notes (Signed)
 Patient ID: Chloe Matthews, female   DOB: July 25, 2000, 23 y.o.   MRN: [id number] Slowly progressing  Vitals:   06/26/23 0230 06/26/23 0300 06/26/23 0330 06/26/23 0400  BP: (!) 141/87 (!) 151/87 (!) 145/89 (!) 145/91  Pulse: 81 83 85 84  Resp:      Temp:      TempSrc:      SpO2:      Weight:      Height:       FHR reactive with no decels UCs irregular  Last exam: Dilation: 9 Effacement (%): 90 Cervical Position: Middle Station: 0 Presentation: Vertex Exam by:: Huey Madrid, RN  Will continue to observe May need to consider augmentation

## 2023-06-26 NOTE — Discharge Summary (Signed)
     Postpartum Discharge Summary       Patient Name: Chloe Matthews DOB: 06-18-2000 MRN: [id number]  Date of admission: 06/24/2023 Delivery date:06/26/2023 Delivering provider: Harlee Lichtenstein Date of discharge: 06/26/2023  Admitting diagnosis: Gestational hypertension, third trimester [O13.3] Intrauterine pregnancy: [redacted]w[redacted]d     Secondary diagnosis:  Principal Problem:   Gestational hypertension, third trimester  Additional problems: none    Discharge diagnosis: Term Pregnancy Delivered and Gestational Hypertension                                              Post partum procedures:{Postpartum procedures:23558} Augmentation: AROM, Cytotec, and IP Foley Complications: None  Hospital course: {Courses:23701}  Magnesium  Sulfate received: {Mag received:30440022} BMZ received: {BMZ received:30440023} Rhophylac:{Rhophylac received:30440032} WNU:{UVO:53664403} T-DaP:{Tdap:23962} Flu: {KVQ:25956} RSV Vaccine received: {RSV:31013} Transfusion:{Transfusion received:30440034} Immunizations administered: Immunization History  Administered Date(s) Administered   HPV 9-valent 07/07/2016   PPD Test 09/10/2017   Rho (D) Immune Globulin 12/09/2022   Tdap 01/30/2020    Physical exam  Vitals:   06/26/23 0530 06/26/23 0600 06/26/23 0630 06/26/23 0700  BP: 130/71 128/87 125/79 137/74  Pulse: 84 83 77 79  Resp:      Temp:      TempSrc:      SpO2:      Weight:      Height:       General: {Exam; general:21111117} Lochia: {Desc; appropriate/inappropriate:30686::"appropriate"} Uterine Fundus: {Desc; firm/soft:30687} Incision: {Exam; incision:21111123} DVT Evaluation: {Exam; dvt:2111122} Labs: Lab Results  Component Value Date   WBC 10.5 06/25/2023   HGB 10.7 (L) 06/25/2023   HCT 33.7 (L) 06/25/2023   MCV 84.5 06/25/2023   PLT 322 06/25/2023      Latest Ref Rng & Units 06/24/2023    9:27 PM  CMP  Glucose 70 - 99 mg/dL 387   BUN 6 - 20 mg/dL 6   Creatinine 5.64 - 3.32  mg/dL 9.51   Sodium 884 - 166 mmol/L 138   Potassium 3.5 - 5.1 mmol/L 3.5   Chloride 98 - 111 mmol/L 107   CO2 22 - 32 mmol/L 20   Calcium 8.9 - 10.3 mg/dL 8.9   Total Protein 6.5 - 8.1 g/dL 6.1   Total Bilirubin 0.0 - 1.2 mg/dL 0.4   Alkaline Phos 38 - 126 U/L 177   AST 15 - 41 U/L 22   ALT 0 - 44 U/L 13    Edinburgh Score:     No data to display            After visit meds:  Allergies as of 06/26/2023       Reactions   Bee Pollen    Drug Class [trazodone  And Nefazodone]    Pt said she had a hard time being waking up   Pollen Extract      Med Rec must be completed prior to using this SMARTLINK***        Discharge home in stable condition Infant Feeding: {Baby feeding:23562} Infant Disposition:{CHL IP OB HOME WITH AYTKZS:01093} Discharge instruction: per After Visit Summary and Postpartum booklet. Activity: Advance as tolerated. Pelvic rest for 6 weeks.  Diet: {OB diet:21111121} Anticipated Birth Control: {Birth Control:23956} Postpartum Appointment:{Outpatient follow up:23559} Additional Postpartum F/U: {PP Procedure:23957} Future Appointments:No future appointments. Follow up Visit:      06/26/2023 Holmes Lusher, CNM

## 2023-06-26 NOTE — Lactation Note (Signed)
 This note was copied from a baby's chart. Lactation Consultation Note  Patient Name: Chloe Matthews [id number] Date: 06/26/2023 Age:25 hours Reason for consult: Initial assessment;Early term 37-38.6wks;Primapara;Infant < 6lbs Mom wants to pump and bottle feed. Mom has pumped w/DEBP but didn't collect anything. LC stated that is normal. LC reviewed all ready set up pump and fitted flange size. Mom has 24 LC changed it to 21. LC reviewed colostrum container and then changing out to pumping in the bottles. Mom had her box w/personal DEBP on her bed w/baby clothes. Mom and FOB going through those things. Newborn feeding habits, pumping, supplementing, milk storage reviewed. Mom seems a little shy. Encouraged to call if has any further questions.  Maternal Data Does the patient have breastfeeding experience prior to this delivery?: No  Feeding    LATCH Score       Type of Nipple: Everted at rest and after stimulation  Comfort (Breast/Nipple): Soft / non-tender         Lactation Tools Discussed/Used Tools: Pump;Flanges Flange Size: 21 Breast pump type: Double-Electric Breast Pump Pump Education: Milk Storage;Setup, frequency, and cleaning Reason for Pumping: pump and bottle Pumping frequency: q 3hr  Interventions Interventions: DEBP;Education;LC Services brochure  Discharge    Consult Status Consult Status: Complete    Reinette Cuneo G 06/26/2023, 10:12 PM

## 2023-06-26 NOTE — Progress Notes (Signed)
 When RN took patient to the bathroom there was a small round salmon color pill in the patients bed. Procardia  was administered at 1334 along with other meds and RN remained in the room while the patient took all of the pills one by one. Patient said she thought she took all of her medications but couldn't remember what color they were and that maybe it was dropped. Raford Bunk, CNM notified of the possibility that patient did not take her dose of procardia , but can't confirm since I did not see a pill drop. Ordered to continue to monitor her blood pressures and call the MD with blood pressures of 140/90.   Magda Schneider, RN

## 2023-06-26 NOTE — Anesthesia Postprocedure Evaluation (Signed)
 Anesthesia Post Note  Patient: Chloe Matthews  Procedure(s) Performed: AN AD HOC LABOR EPIDURAL     Patient location during evaluation: Mother Baby Anesthesia Type: Epidural Level of consciousness: awake, awake and alert and oriented Pain management: satisfactory to patient Vital Signs Assessment: post-procedure vital signs reviewed and stable Respiratory status: spontaneous breathing, nonlabored ventilation and respiratory function stable Cardiovascular status: blood pressure returned to baseline and stable Postop Assessment: no headache, no backache, no apparent nausea or vomiting, patient able to bend at knees, adequate PO intake and able to ambulate Anesthetic complications: no   No notable events documented.  Last Vitals:  Vitals:   06/26/23 1330 06/26/23 1515  BP: 125/79 (!) 133/91  Pulse: 63 65  Resp:  18  Temp:  36.6 C  SpO2:  100%    Last Pain:  Vitals:   06/26/23 1739  TempSrc:   PainSc: 0-No pain   Pain Goal:                   Dov Dill

## 2023-06-27 LAB — CBC
HCT: 30.4 % — ABNORMAL LOW (ref 36.0–46.0)
Hemoglobin: 9.8 g/dL — ABNORMAL LOW (ref 12.0–15.0)
MCH: 27.3 pg (ref 26.0–34.0)
MCHC: 32.2 g/dL (ref 30.0–36.0)
MCV: 84.7 fL (ref 80.0–100.0)
Platelets: 311 10*3/uL (ref 150–400)
RBC: 3.59 MIL/uL — ABNORMAL LOW (ref 3.87–5.11)
RDW: 14.4 % (ref 11.5–15.5)
WBC: 15.5 10*3/uL — ABNORMAL HIGH (ref 4.0–10.5)
nRBC: 0.1 % (ref 0.0–0.2)

## 2023-06-27 LAB — RH IG WORKUP (INCLUDES ABO/RH)
Gestational Age(Wks): 38
Unit division: 0

## 2023-06-27 MED ORDER — POLYETHYLENE GLYCOL 3350 17 G PO PACK
17.0000 g | PACK | Freq: Every day | ORAL | Status: DC
  Administered 2023-06-27: 17 g via ORAL
  Filled 2023-06-27: qty 1

## 2023-06-27 NOTE — Clinical Social Work Maternal (Signed)
 CLINICAL SOCIAL WORK MATERNAL/CHILD NOTE  Patient Details  Name: Chloe Matthews MRN: [id number] Date of Birth: [birth year]  Date:  06/27/2023  Clinical Social Worker Initiating Note:  Chloe Matthews Date/Time: Initiated:  06/27/23/1251     Child's Name:  Chloe Matthews   Biological Parents:  Mother, Father Chloe Matthews 08/25/1998 Chloe Matthews)   Need for Interpreter:  None   Reason for Referral:  Late or No Prenatal Care  , Behavioral Health Concerns   Address:  [address] [place] [address]    Phone number:  [phone] (home)     Additional phone number:   Household Members/Support Persons (HM/SP):   Household Member/Support Person 1   HM/SP Name Relationship DOB or Age  HM/SP -1 Chloe Matthews FOB    HM/SP -2        HM/SP -3        HM/SP -4        HM/SP -5        HM/SP -6        HM/SP -7        HM/SP -8          Natural Supports (not living in the home):  Spouse/significant other, Immediate Family   Professional Supports: None   Employment: Unemployed   Type of Work:     Education:  Some Materials engineer arranged:    Architect:  Media planner , Medicaid   Other Resources:      Cultural/Religious Considerations Which May Impact Care:    Strengths:  Ability to meet basic needs  , Understanding of illness, Home prepared for child  , Pediatrician chosen   Psychotropic Medications:         Pediatrician:    Armed forces operational officer area  Pediatrician List:   [facility]  [place]    [place]    [place] [facility]      Pediatrician Fax Number:    Risk Factors/Current Problems:  Mental Health Concerns   (Limited Prenatal care)   Cognitive State:  Able to Concentrate  , Alert  , Linear Thinking  , Insightful  , Goal Oriented     Mood/Affect:  Calm  , Comfortable  , Interested  , Relaxed     CSW Assessment: CSW received a consult for  Limited PNC, hx of autism, anxiety and depression. CSW entered the room, introduced herself and acknowledged FOB was present. CSW asked MOB for privacy reasons could FOB stepout for the assessment, MOB was agreeable and FOB stepped out. CSW explained her role and the reason for the visit. MOB presented holding/feeding the infant while laying comfortably in bed. MOB presented as calm, was agreeable to consult and remained engaged throughout encounter.   CSW congratulated MOB on the first infant and asked about the delivery. MOB reported the delivery was quick and she only pushed one time. CSW collected MOB's demographic information and inquired about her mental health history. MOB reported being preciously diagnosed with anxiety and depression during her first [facility] admission in 2019. MOB reported during her second [facility] admission in 2022 she was diagnosed with ADHD and autism. MOB reported being prescribed medication in the past; however denied use of all medication currently. MOB reported her mood as stable during her pregnancy and since her last [facility] admission. CSW provided education regarding the baby blues period vs. perinatal mood disorders, discussed treatment and gave  resources for mental health follow up if concerns arise.  CSW recommends self-evaluation during the postpartum time period using the New Mom Checklist from Postpartum Progress and encouraged MOB to contact a medical professional if symptoms are noted at any time.  CSW assessed for safety with MOB SI/HI/DV;MOB denied all.  CSW asked MOB does she receive support resources; MOB said No(WIC and food stamps).  MOB reported having all essential items for the infant including a carseat, bassinet and crib for safe sleeping. CSW provided review of Sudden Infant Death Syndrome (SIDS) precautions.  CSW asked MOB about the barriers she experienced with obtaining prenatal care. MOB reported attending ultrasounds and exploring different [place]; however she  never was able to secure one. CSW informed MOB due to limited [facility] during her pregnancy; the hospital will perform a UDS and CDS on the infant. If the screenings return with positive results a report to CPS will be made; MOB was understanding. MOB denied using any/all substances during pregnancy  The infant's UDS tested negative for all substances; CSW will continue to monitor the CDS and make a CPS report if warranted.    CSW Plan/Description:  No Further Intervention Required/No Barriers to Discharge, Sudden Infant Death Syndrome (SIDS) Education, Perinatal Mood and Anxiety Disorder (PMADs) Education, Hospital Drug Screen Policy Information, Other Information/Referral to [facility], CSW Will Continue to Monitor Umbilical Cord Tissue Drug Screen Results and Make Report if Chloe Matthews 06/27/2023, 12:58 PM

## 2023-06-27 NOTE — Progress Notes (Signed)
 POSTPARTUM PROGRESS NOTE  Subjective: Chloe Matthews is a 23 y.o. G1P1001 s/p SVD at [redacted]w[redacted]d.  She reports she doing okay overall. No acute events overnight. She is ambulating independently, voiding without issue. She has passed flatus. Pain is well controlled overall. Lochia is the amount of a typical period.  Objective: Vitals:   06/26/23 2330 06/27/23 0515  BP: 132/77 (!) 139/95  Pulse: 88 91  Resp: 16 20  Temp:  98.4 F (36.9 C)  SpO2: 98% 98%   Physical Exam:  General: alert, cooperative and no distress Chest: no respiratory distress Abdomen: soft, mildly tender  Extremities: BLE with mild edema to level of mid-shin, no erythema, no tenderness.   Recent Labs    06/26/23 0924 06/27/23 0522  HGB 10.9* 9.8*  HCT 34.1* 30.4*    Assessment/Plan: Chloe Matthews is a 23 y.o. G1P1001 s/p SVD at [redacted]w[redacted]d.  Routine Postpartum Care: Doing well, pain well-controlled.  -- Continue routine care, lactation support  -- Contraception: Undecided  -- Feeding: Pump & bottle feed  #Elevated BP: Cont Procardia, Lasix, Potassium   Dispo: Plan for discharge today vs tomorrow .  Carey Chapman, MD 06/27/2023 6:31 AM  Attestation of Attending Supervision of Resident: Evaluation and management procedures were performed by the [facility] Resident under my supervision. I was immediately available for direct supervision, assistance and direction throughout this encounter.  I also confirm that I have verified the information documented in the resident's note, and that I have also personally reperformed the pertinent components of the physical exam and all of the medical decision making activities.  I have also made any necessary editorial changes.  Possible PM discharge if baby cleared by peds.  Abigail Abler, MD Attending Obstetrician & Gynecologist, [facility] for [facility], [facility] 06/27/2023 12:20 PM

## 2023-06-28 ENCOUNTER — Other Ambulatory Visit (HOSPITAL_COMMUNITY): Payer: Self-pay

## 2023-06-28 MED ORDER — COCONUT OIL OIL: 1.0000 | TOPICAL_OIL | Status: AC | PRN

## 2023-06-28 MED ORDER — ACETAMINOPHEN 325 MG PO TABS: 650.0000 mg | ORAL_TABLET | ORAL | Status: AC | PRN

## 2023-06-28 MED ORDER — POLYETHYLENE GLYCOL 3350 17 G PO PACK: 17.0000 g | PACK | Freq: Every day | ORAL | Status: AC

## 2023-06-28 MED ORDER — IBUPROFEN 600 MG PO TABS
600.0000 mg | ORAL_TABLET | Freq: Four times a day (QID) | ORAL | 0 refills | Status: AC
  Filled 2023-06-28: qty 30, 8d supply, fill #0

## 2023-06-28 MED ORDER — ACETAMINOPHEN 325 MG PO TABS: 650.0000 mg | ORAL_TABLET | ORAL | Status: DC | PRN

## 2023-06-28 MED ORDER — POTASSIUM CHLORIDE CRYS ER 20 MEQ PO TBCR
40.0000 meq | EXTENDED_RELEASE_TABLET | Freq: Every day | ORAL | 0 refills | Status: AC
  Filled 2023-06-28 (×2): qty 10, 5d supply, fill #0

## 2023-06-28 MED ORDER — NIFEDIPINE ER 30 MG PO TB24
30.0000 mg | ORAL_TABLET | Freq: Every day | ORAL | 0 refills | Status: AC
  Filled 2023-06-28: qty 90, 90d supply, fill #0

## 2023-06-28 MED ORDER — FUROSEMIDE 20 MG PO TABS: 20.0000 mg | ORAL_TABLET | Freq: Every day | ORAL | 0 refills | Status: DC

## 2023-06-28 MED ORDER — POTASSIUM CHLORIDE CRYS ER 20 MEQ PO TBCR
40.0000 meq | EXTENDED_RELEASE_TABLET | Freq: Every day | ORAL | 0 refills | Status: DC
Start: 2023-06-29 — End: 2023-06-28

## 2023-06-28 MED ORDER — NIFEDIPINE ER 30 MG PO TB24: 30.0000 mg | ORAL_TABLET | Freq: Every day | ORAL | 0 refills | Status: DC

## 2023-06-28 MED ORDER — WITCH HAZEL-GLYCERIN EX PADS: 1.0000 | MEDICATED_PAD | CUTANEOUS | 12 refills | Status: DC | PRN

## 2023-06-28 MED ORDER — WITCH HAZEL-GLYCERIN EX PADS
1.0000 | MEDICATED_PAD | CUTANEOUS | 12 refills | Status: AC | PRN
  Filled 2023-06-28: qty 40, 15d supply, fill #0

## 2023-06-28 MED ORDER — POLYETHYLENE GLYCOL 3350 17 G PO PACK: 17.0000 g | PACK | Freq: Every day | ORAL | Status: DC

## 2023-06-28 MED ORDER — FUROSEMIDE 20 MG PO TABS
20.0000 mg | ORAL_TABLET | Freq: Every day | ORAL | 0 refills | Status: DC
  Filled 2023-06-28 (×2): qty 5, 5d supply, fill #0

## 2023-06-28 MED ORDER — IBUPROFEN 600 MG PO TABS: 600.0000 mg | ORAL_TABLET | Freq: Four times a day (QID) | ORAL | 0 refills | Status: DC

## 2023-07-05 ENCOUNTER — Ambulatory Visit

## 2023-07-06 ENCOUNTER — Other Ambulatory Visit: Payer: Self-pay

## 2023-07-06 ENCOUNTER — Ambulatory Visit (INDEPENDENT_AMBULATORY_CARE_PROVIDER_SITE_OTHER)

## 2023-07-06 VITALS — BP 136/92 | HR 89 | Ht 66.0 in | Wt 200.9 lb

## 2023-07-06 DIAGNOSIS — Z0289 Encounter for other administrative examinations: Secondary | ICD-10-CM

## 2023-07-06 DIAGNOSIS — Z0130 Encounter for examination of blood pressure without abnormal findings: Secondary | ICD-10-CM

## 2023-07-06 NOTE — Progress Notes (Signed)
 Meet with patient while in office for concerns of lump in breast.   Patient is pumping some, latching some between feeds and bottle feeding. She reports she is pumping 3 x a day and latching in between feeds if infant is upset. She is offering Enfamil Gentlease in bottles.   She reports a lump that was noted 3 days ago on the upper outer right breast. Upon examination of the breasts, they are overall soft and compressible. There is a 5-6 cm flat firmer area noted in the upper outer right breast, it is not noted on the left breast.   She has been using heat, massage and latching infant to help with lump noted.   Reviewed ice packs every 2-3 hours for 10-15 minutes prior to latching or pumping, no massage to area, limited heat to area and pumping on a regular schedule. Reviewed can take Ibuprofen  600 mg every 6 hours. Reviewed to please call or message the office if not resolved in 2-3 days.   Patient voiced understanding.

## 2023-07-06 NOTE — Progress Notes (Signed)
 Pt here today for BP check s/p vaginal delivery on 06/26/23.  Pt denies visual disturbance or headaches.  Last dose of Nifedipine  30 mg was yesterday afternoon.  BP LA 112/92.  Rpt BP RA  136/92.   Pt reports that she breastfeeds and pumps and has had a bulge in her right breast that has not gone away.  Genevia Kern, IBCLC, at bedside to evaluate.  Reviewed BP with Cresenzo, MD and pt recommended to continue on current dose of Nifedipine .  Pt informed providers recommendation, to continue to monitor for sx's elevated BP, and to contact the office with concerns.  Pt verbalized understanding with no further questions.   Gareth Fitzner,RN  07/06/23

## 2023-07-15 NOTE — Progress Notes (Signed)
 Subjective: Patient ID:  Chloe Matthews is a 23 y.o. female  Chief Complaint  Patient presents with  . Cyst    Pt reports cyst on R buttock.  Denies drainage or discharge.  Reports same is painful.    The following information was reviewed by members of the visit team:  Tobacco  Allergies  Meds  Problems  Med Hx  Surg Hx  OB Status   Fam Hx    23 year old female presents for evaluation of symptoms ongoing 2 weeks.  She reports a painful area to the inner right buttock, she has had abscesses in the past, but never in this area.  She states she has been waiting for this to pop, but it hasn't.  She also reports she is breastfeeding, and did have a vaginal delivery 2 weeks ago.  She denies any hx of DM, also denies any fever, chills, nausea, vomiting or other symptoms.   She does have elevated blood pressure today, and is being treated by her provider with nifedipine .     Review of Systems  Constitutional:  Negative for chills and fever.  Respiratory:  Negative for shortness of breath.   Cardiovascular:  Negative for chest pain.  Gastrointestinal:  Negative for abdominal pain, diarrhea, nausea and vomiting.  Genitourinary:  Negative for dysuria.  Skin:  Negative for color change and wound.  Neurological:  Negative for dizziness and headaches.      Objective  Vitals:   07/15/23 1552  BP: (!) 128/96  Pulse: 104  Resp: 18  Temp: 98.2 F (36.8 C)  TempSrc: Tympanic  SpO2: 100%  Weight: 90.7 kg (200 lb)  Height: 1.676 m (5' 6)    Patient's last menstrual period was 09/27/2022.   Behavioral Health Screening  Patient Health Questionnaire-2 Score: 0 (07/15/2023  3:52 PM)      Patient's Depression screening is Negative   Depression Plan: Normal/Negative Screening  Physical Exam Vitals and nursing note reviewed.  Constitutional:      Appearance: Normal appearance. She is obese.   Cardiovascular:     Rate and Rhythm: Normal rate and regular rhythm.     Heart  sounds: Normal heart sounds.  Pulmonary:     Effort: Pulmonary effort is normal.     Breath sounds: Normal breath sounds.  Abdominal:     General: Bowel sounds are normal.   Skin:    General: Skin is warm and dry.     Capillary Refill: Capillary refill takes 2 to 3 seconds.     Comments: Soft round abscess medial right buttock, no induration, no drainage, TTP   Neurological:     General: No focal deficit present.     Mental Status: She is alert and oriented to person, place, and time.   Psychiatric:        Mood and Affect: Mood normal.        Behavior: Behavior normal.      No results found for this or any previous visit (from the past 24 hours).   Radiologist interpretation:   No orders to display    Incision and drainage  Date/Time: 07/15/2023 3:50 PM  Performed by: Rosaline Jama Collet, NP Authorized by: Rosaline Jama Collet, NP   Consent:    Consent obtained:  Verbal   Consent given by:  Patient   Risks, benefits, and alternatives were discussed: yes     Risks discussed:  Bleeding, incomplete drainage, pain and infection   Alternatives discussed:  No treatment Universal  protocol:    Procedure explained and questions answered to patient or proxy's satisfaction: yes     Patient identity confirmed:  Verbally with patient Location:    Type:  Abscess   Location:  Anogenital   Anogenital location: right inner buttock. Pre-procedure details:    Skin preparation:  Antiseptic wash Sedation:    Sedation type:  None Anesthesia:    Anesthesia method:  Topical application   Topical anesthetic:  LET Procedure type:    Complexity:  Simple Procedure details:    Incision types:  Stab incision   Drainage:  Purulent   Drainage amount:  Copious   Wound treatment:  Wound left open   Packing materials:  None Post-procedure details:    Procedure completion:  Tolerated well, no immediate complications  1629:  pt is currently breast feeding, discussed with her treatment  with cephalexin and mupirocin pending culture results, she is aware this may change and is agreeable.    Assessment/Plan   Chloe Matthews was seen today for cyst.  Diagnoses and all orders for this visit:  Abscess of right buttock -     Discontinue: lidocaine -racepinep-tetracaine  (L.E.T) 4-0.05-0.5 % gel -     Aerobic Culture -     Discontinue: lidocaine -EPINEPHrine -tetracaine  (L.E.T.,lido-epineph bit-tetra,) 4-0.18-0.5 % gel; Apply 3 mL topically once for 1 dose. -     Discontinue: lidocaine -EPINEPHrine -tetracaine  (L.E.T.,lido-epineph bit-tetra,) 4-0.18-0.5 % gel; Apply 3 mL topically once for 1 dose. -     lidocaine  4%-EPINEPHrine -tetracaine  0.5% (L.E.T.) topical gel 3 mL -     cephALEXin (KEFLEX) 500 mg capsule; Take 2 capsules (1,000 mg total) by mouth 2 (two) times a day for 7 days. -     mupirocin (BACTROBAN) 2 % ointment; Apply topically 3 (three) times a day for 7 days.  Other orders -     Incision and drainage    Patient has been instructed on RX/ OTC medications, dosages, side effects, and possible interactions as associated with each diagnosis in my impression and plan above.  2.   Patient education (verbal/handout) given on diagnosis, pathophysiology, treatment of diagnosis, side effects of medication use for treatment, restrictions while taking medication.  Supportive       Care measures as directed on AVS.  Red Flags associated with diagnosis/es were reviewed and patient instructed on action plan if red flags develop.  3.   Urgent Care Disposition:  Follow up with PCP       They have been instructed that if symptoms worse that should go to Urgent Care, go to the nearest ED, or activate EMS.  4.   Patient agreed with plan and voiced understanding.  NO barriers to adherence perceived by myself.  Electronically signed: Rosaline Jama Collet FNP  [place] 07/15/2023 4:50 PM

## 2023-08-09 ENCOUNTER — Ambulatory Visit: Payer: Self-pay | Admitting: Obstetrics and Gynecology

## 2023-08-09 NOTE — Progress Notes (Signed)
 No Show  Mardy Shropshire, MD FMOB Fellow, Faculty practice [facility] for [facility]
